# Patient Record
Sex: Male | Born: 1965 | Race: Black or African American | Hispanic: No | Marital: Married | Smoking: Never smoker
Health system: Southern US, Community
[De-identification: ages and names within clinical notes are randomized; demographics above are authoritative.]

## PROBLEM LIST (undated history)

## (undated) DIAGNOSIS — E7800 Pure hypercholesterolemia, unspecified: Secondary | ICD-10-CM

## (undated) DIAGNOSIS — R079 Chest pain, unspecified: Secondary | ICD-10-CM

## (undated) DIAGNOSIS — G4733 Obstructive sleep apnea (adult) (pediatric): Secondary | ICD-10-CM

## (undated) HISTORY — DX: Obstructive sleep apnea (adult) (pediatric): G47.33

## (undated) HISTORY — DX: Chest pain, unspecified: R07.9

---

## 1998-07-12 ENCOUNTER — Ambulatory Visit (HOSPITAL_COMMUNITY): Admission: RE | Admit: 1998-07-12 | Discharge: 1998-07-12 | Payer: Self-pay | Admitting: Family Medicine

## 1998-09-22 ENCOUNTER — Ambulatory Visit (HOSPITAL_BASED_OUTPATIENT_CLINIC_OR_DEPARTMENT_OTHER): Admission: RE | Admit: 1998-09-22 | Discharge: 1998-09-22 | Payer: Self-pay | Admitting: *Deleted

## 2009-03-29 ENCOUNTER — Encounter: Admission: RE | Admit: 2009-03-29 | Discharge: 2009-03-29 | Payer: Self-pay | Admitting: Family Medicine

## 2009-03-29 IMAGING — CR DG FOOT COMPLETE 3+V*R*
3 series · 3 of 3 positions shown · non-contrast
Comparison: None

CLINICAL DATA: Heel pain after basketball injury.

RIGHT FOOT COMPLETE - 3+ VIEW

[view not recorded (1 of 3)]
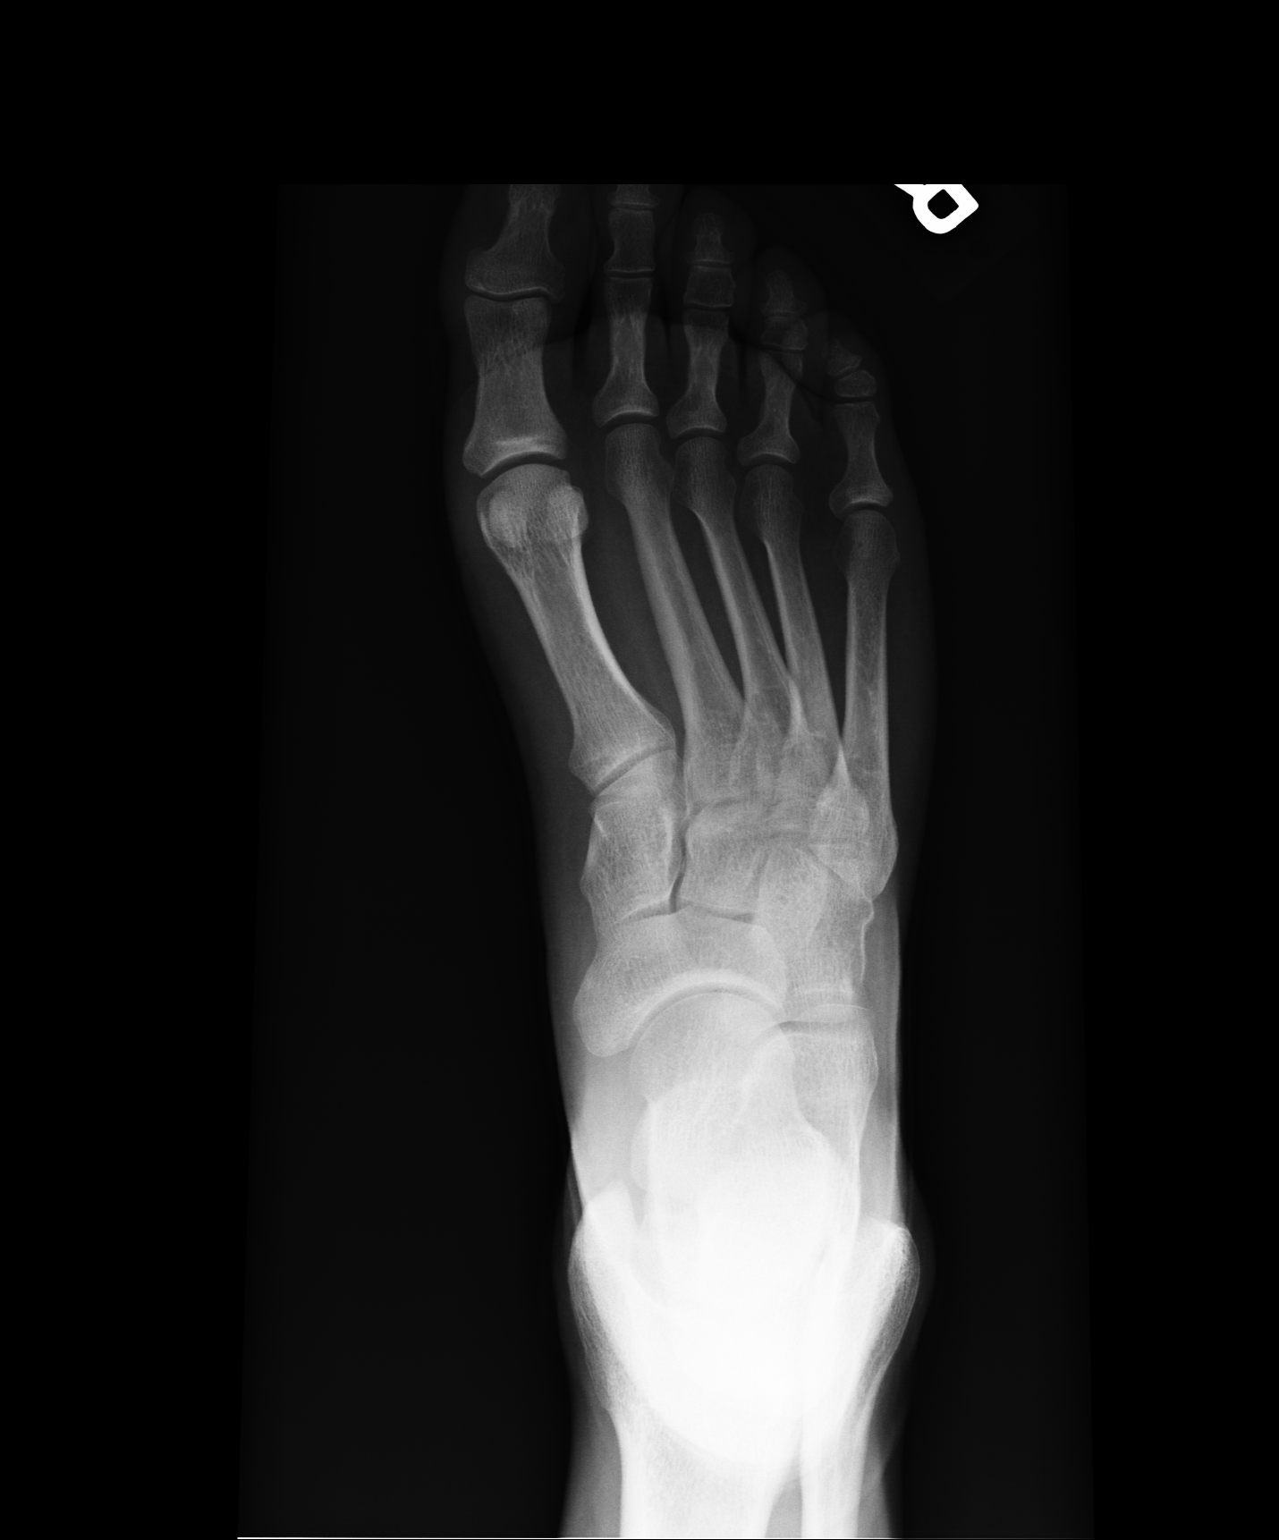

[view not recorded (2 of 3)]
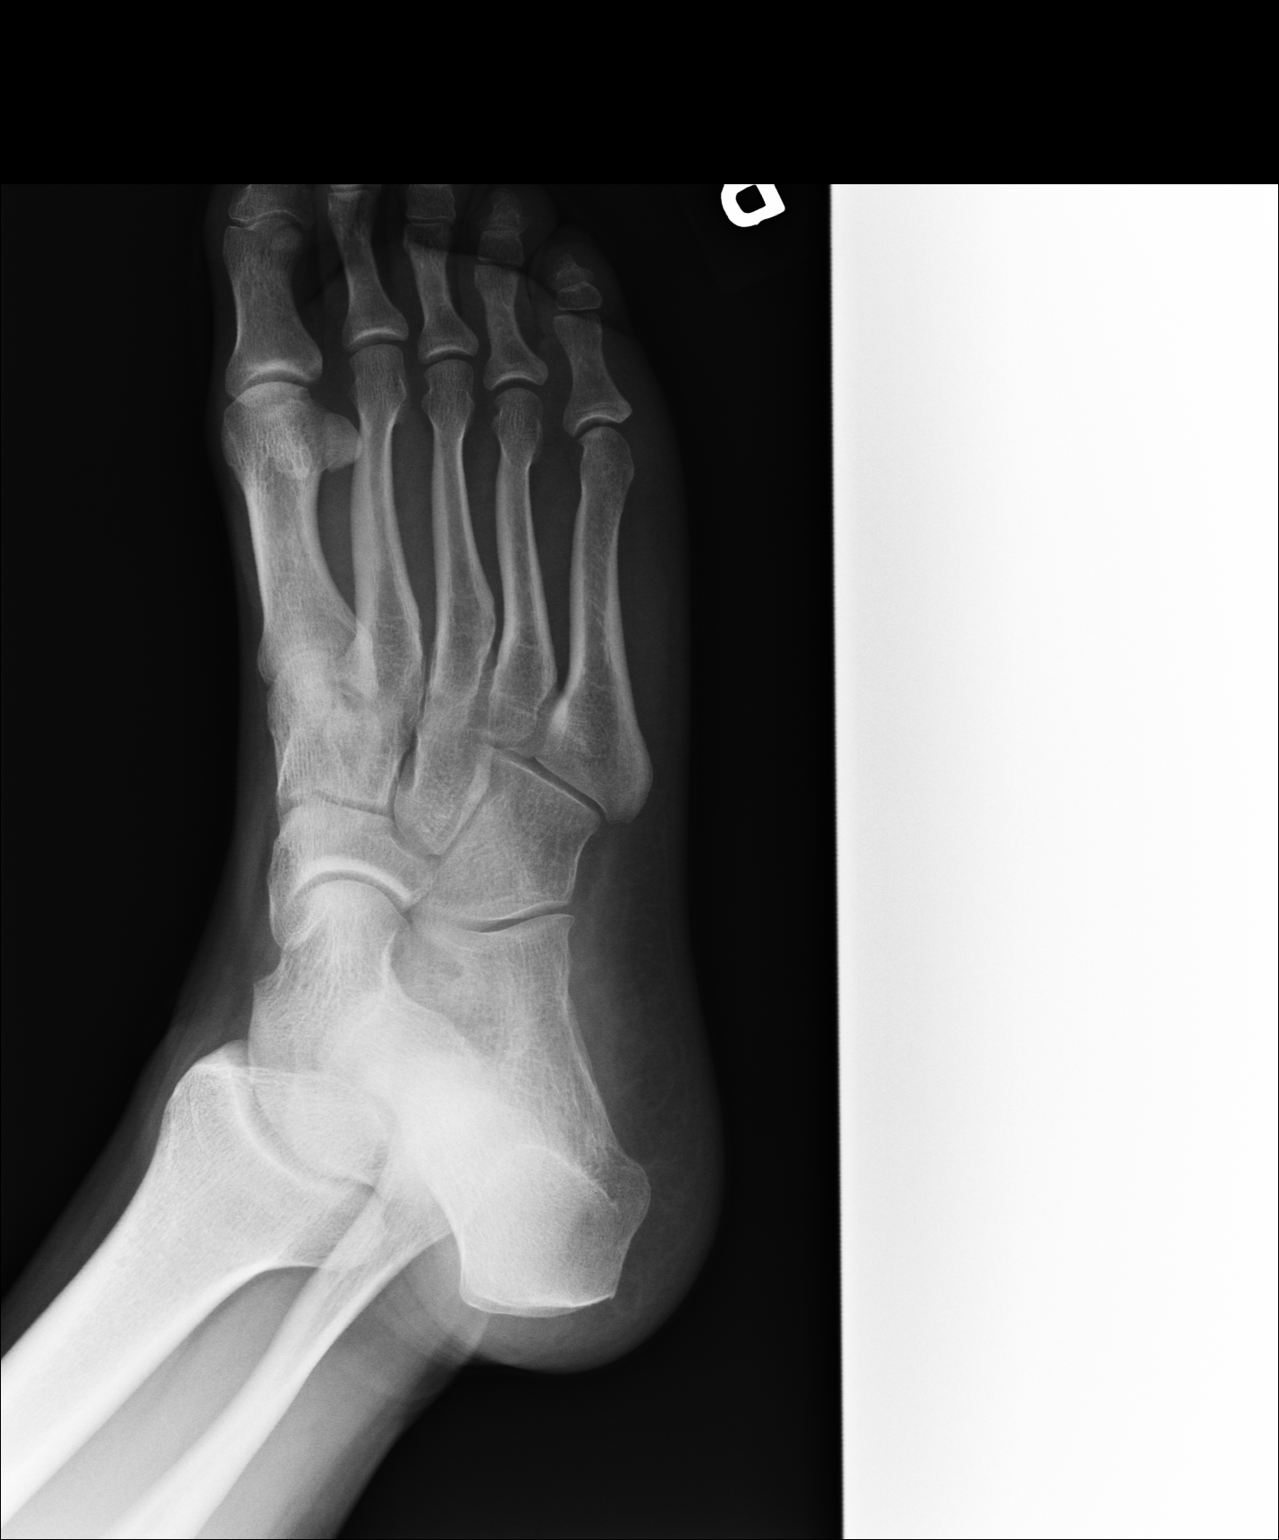

[view not recorded (3 of 3)]
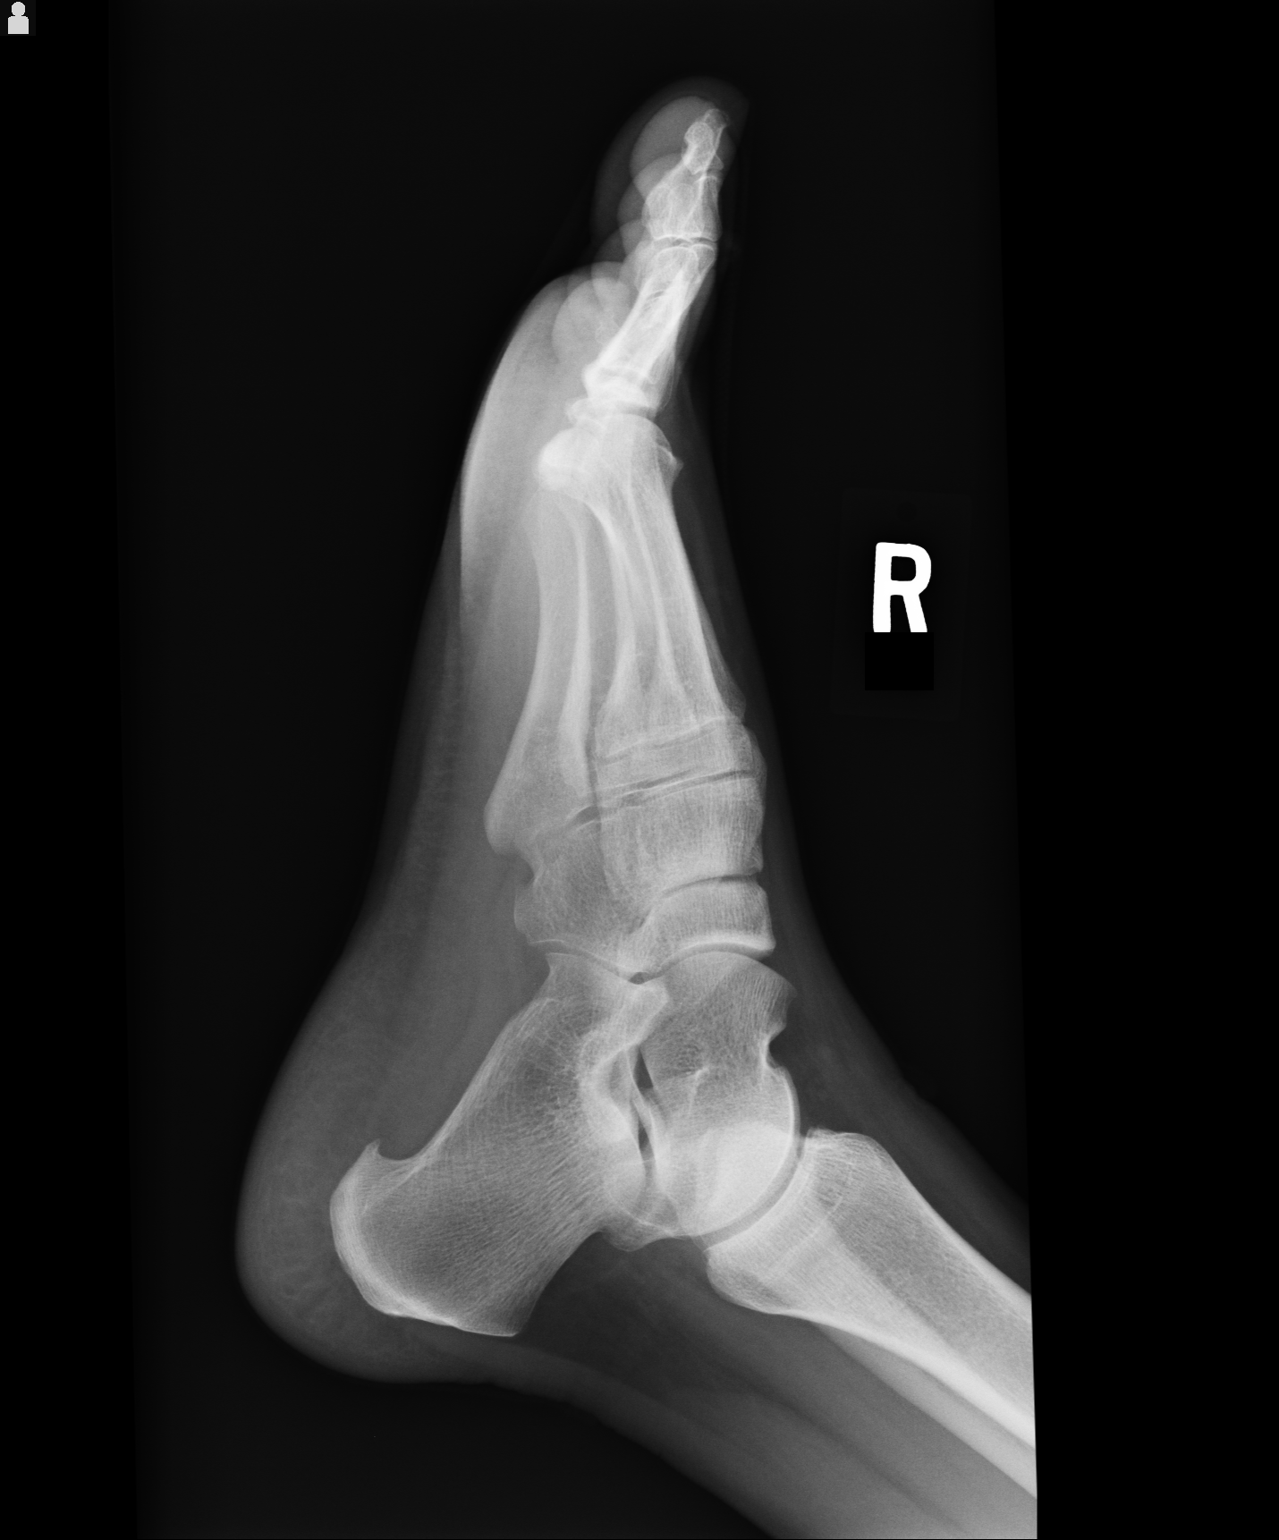

[3 of 3 positions shown; findings below may reference images not displayed]

FINDINGS: There is a plantar calcaneal spur.  No fracture.
Remainder of the foot is unremarkable.
IMPRESSION: Calcaneal spur without acute fracture.

## 2017-09-22 DIAGNOSIS — R0789 Other chest pain: Secondary | ICD-10-CM | POA: Diagnosis not present

## 2017-09-25 DIAGNOSIS — J309 Allergic rhinitis, unspecified: Secondary | ICD-10-CM | POA: Diagnosis not present

## 2017-09-25 DIAGNOSIS — R0789 Other chest pain: Secondary | ICD-10-CM | POA: Diagnosis not present

## 2017-09-25 DIAGNOSIS — E7800 Pure hypercholesterolemia, unspecified: Secondary | ICD-10-CM | POA: Diagnosis not present

## 2017-10-09 DIAGNOSIS — Z0000 Encounter for general adult medical examination without abnormal findings: Secondary | ICD-10-CM | POA: Diagnosis not present

## 2017-10-09 DIAGNOSIS — E7800 Pure hypercholesterolemia, unspecified: Secondary | ICD-10-CM | POA: Diagnosis not present

## 2017-10-09 DIAGNOSIS — R400 Somnolence: Secondary | ICD-10-CM | POA: Diagnosis not present

## 2018-04-07 DIAGNOSIS — R0982 Postnasal drip: Secondary | ICD-10-CM | POA: Diagnosis not present

## 2018-10-02 DIAGNOSIS — J069 Acute upper respiratory infection, unspecified: Secondary | ICD-10-CM | POA: Diagnosis not present

## 2018-10-11 DIAGNOSIS — N529 Male erectile dysfunction, unspecified: Secondary | ICD-10-CM | POA: Diagnosis not present

## 2018-10-11 DIAGNOSIS — E7800 Pure hypercholesterolemia, unspecified: Secondary | ICD-10-CM | POA: Diagnosis not present

## 2018-10-11 DIAGNOSIS — Z0000 Encounter for general adult medical examination without abnormal findings: Secondary | ICD-10-CM | POA: Diagnosis not present

## 2018-10-11 DIAGNOSIS — Z125 Encounter for screening for malignant neoplasm of prostate: Secondary | ICD-10-CM | POA: Diagnosis not present

## 2019-01-17 DIAGNOSIS — J1189 Influenza due to unidentified influenza virus with other manifestations: Secondary | ICD-10-CM | POA: Diagnosis not present

## 2019-04-07 DIAGNOSIS — M7732 Calcaneal spur, left foot: Secondary | ICD-10-CM | POA: Diagnosis not present

## 2019-04-07 DIAGNOSIS — M722 Plantar fascial fibromatosis: Secondary | ICD-10-CM | POA: Diagnosis not present

## 2019-04-14 DIAGNOSIS — M722 Plantar fascial fibromatosis: Secondary | ICD-10-CM | POA: Diagnosis not present

## 2019-04-21 DIAGNOSIS — M722 Plantar fascial fibromatosis: Secondary | ICD-10-CM | POA: Diagnosis not present

## 2019-04-21 DIAGNOSIS — M71572 Other bursitis, not elsewhere classified, left ankle and foot: Secondary | ICD-10-CM | POA: Diagnosis not present

## 2020-03-05 ENCOUNTER — Ambulatory Visit: Payer: Self-pay

## 2021-02-07 ENCOUNTER — Encounter (HOSPITAL_COMMUNITY): Payer: Self-pay | Admitting: *Deleted

## 2021-02-07 ENCOUNTER — Emergency Department (HOSPITAL_COMMUNITY)
Admission: EM | Admit: 2021-02-07 | Discharge: 2021-02-07 | Disposition: A | Discharge status: Home / Self Care | Payer: 59 | Attending: Emergency Medicine | Admitting: Emergency Medicine

## 2021-02-07 ENCOUNTER — Emergency Department (HOSPITAL_COMMUNITY): Payer: 59

## 2021-02-07 DIAGNOSIS — I4510 Unspecified right bundle-branch block: Secondary | ICD-10-CM | POA: Diagnosis not present

## 2021-02-07 DIAGNOSIS — R079 Chest pain, unspecified: Secondary | ICD-10-CM | POA: Diagnosis present

## 2021-02-07 DIAGNOSIS — R0789 Other chest pain: Secondary | ICD-10-CM | POA: Insufficient documentation

## 2021-02-07 HISTORY — DX: Pure hypercholesterolemia, unspecified: E78.00

## 2021-02-07 LAB — CBC
HCT: 48.2 % (ref 39.0–52.0)
Hemoglobin: 16.5 g/dL (ref 13.0–17.0)
MCH: 31 pg (ref 26.0–34.0)
MCHC: 34.2 g/dL (ref 30.0–36.0)
MCV: 90.6 fL (ref 80.0–100.0)
Platelets: 207 10*3/uL (ref 150–400)
RBC: 5.32 MIL/uL (ref 4.22–5.81)
RDW: 12.5 % (ref 11.5–15.5)
WBC: 6.4 10*3/uL (ref 4.0–10.5)
nRBC: 0 % (ref 0.0–0.2)

## 2021-02-07 LAB — BASIC METABOLIC PANEL
Anion gap: 12 (ref 5–15)
BUN: 21 mg/dL — ABNORMAL HIGH (ref 6–20)
CO2: 26 mmol/L (ref 22–32)
Calcium: 8.9 mg/dL (ref 8.9–10.3)
Chloride: 101 mmol/L (ref 98–111)
Creatinine, Ser: 1.05 mg/dL (ref 0.61–1.24)
GFR, Estimated: >60 mL/min (ref >60)
Glucose, Bld: 104 mg/dL — ABNORMAL HIGH (ref 70–99)
Potassium: 3.4 mmol/L — ABNORMAL LOW (ref 3.5–5.1)
Sodium: 139 mmol/L (ref 135–145)

## 2021-02-07 LAB — TROPONIN I (HIGH SENSITIVITY)
Troponin I (High Sensitivity): 4 ng/L (ref <18)
Troponin I (High Sensitivity): 5 ng/L (ref <18)

## 2021-02-07 MED ORDER — ACETAMINOPHEN 325 MG PO TABS
650.0000 mg | ORAL_TABLET | Freq: Once | ORAL | Status: AC
Start: 2021-02-07 — End: 2021-02-07
  Administered 2021-02-07: 650 mg via ORAL
  Filled 2021-02-07: qty 2

## 2021-02-07 NOTE — ED Notes (Signed)
Patient Alert and oriented to baseline. Stable and ambulatory to baseline. Patient verbalized understanding of the discharge instructions.  Patient belongings were taken by the patient.   

## 2021-02-07 NOTE — ED Triage Notes (Signed)
Pt reports onset this am of chest pain that he describes as "a spasm". Denies sob or n/v.

## 2021-02-07 NOTE — Discharge Instructions (Addendum)
-  Referral has been spent to cardiology for your new EKG findings as we discussed. They should call you within a week to schedule an appointment. If you do not hear from them you should call the number listed.  Your EKG shows a right bundle branch block. Without having a prior EKG to compare this too we are unsure how long it has been there.   Follow up with primary care doctor for recheck as needed.

## 2021-02-07 NOTE — ED Provider Notes (Signed)
[facility] EMERGENCY DEPARTMENT Provider Note   CSN: [id number] Arrival date & time: 02/07/21  1962     History Chief Complaint  Patient presents with  . Chest Pain    Thomas Mayo is a 55 y.o. male with past medical history significant for high cholesterol.  HPI Patient presents to emergency department today with chief complaint of chest pain x2 days.  The pain has been intermittent.  Is located in the left side of his chest and feels like a spasm or sharp pain.  He states he first noticed the pain yesterday when he was reaching for a space in a higher cabinet.  Pain lasted for several seconds.  This morning the pain returned when he was getting dressed for work.  Again lasting only seconds.  Patient rates the pain 7 out of 10 in severity.  He denies any associated diaphoresis, radiation of pain to jaw, back or arms, nausea, emesis.  Patient admits to playing 2 games of golf the two days before pain started. He denies any increase in stress.  Denies any drug or alcohol or tobacco use. Denies lower extremity pain or swelling, recent travel or immobilization, history of PE or DVT, family or personal history of bleeding or clotting disorders, cough or hemoptysis. Cardiac history includes father having MI in his 33s thought to be related to agent orange exposure.      Past Medical History:  Diagnosis Date  . High cholesterol     There are no problems to display for this patient.   History reviewed. No pertinent surgical history.     History reviewed. No pertinent family history.  Social History   Tobacco Use  . Smoking status: Never Smoker  . Smokeless tobacco: Never Used  Substance Use Topics  . Alcohol use: Never  . Drug use: Never    Home Medications Prior to Admission medications   Not on File    Allergies    Patient has no known allergies.  Review of Systems   Review of Systems All other systems are reviewed and are negative for acute  change except as noted in the HPI.  Physical Exam Updated Vital Signs BP 134/88 (BP Location: Left Arm)   Pulse 83   Temp (!) 97.5 F (36.4 C) (Oral)   Resp 17   Ht 5' 8.5" (1.74 m)   Wt 82.1 kg   SpO2 99%   BMI 27.12 kg/m   Physical Exam Vitals and nursing note reviewed.  Constitutional:      General: He is not in acute distress.    Appearance: He is not ill-appearing.  HENT:     Head: Normocephalic and atraumatic.     Right Ear: Tympanic membrane and external ear normal.     Left Ear: Tympanic membrane and external ear normal.     Nose: Nose normal.     Mouth/Throat:     Mouth: Mucous membranes are moist.     Pharynx: Oropharynx is clear.  Eyes:     General: No scleral icterus.       Right eye: No discharge.        Left eye: No discharge.     Extraocular Movements: Extraocular movements intact.     Conjunctiva/sclera: Conjunctivae normal.     Pupils: Pupils are equal, round, and reactive to light.  Neck:     Vascular: No JVD.  Cardiovascular:     Rate and Rhythm: Normal rate and regular rhythm.  Pulses: Normal pulses.          Radial pulses are 2+ on the right side and 2+ on the left side.       Dorsalis pedis pulses are 2+ on the right side and 2+ on the left side.     Heart sounds: Normal heart sounds.  Pulmonary:     Comments: Lungs clear to auscultation in all fields. Symmetric chest rise. No wheezing, rales, or rhonchi. Chest:     Chest wall: Tenderness present.  Abdominal:     Comments: Abdomen is soft, non-distended, and non-tender in all quadrants. No rigidity, no guarding. No peritoneal signs.  Musculoskeletal:        General: Normal range of motion.     Cervical back: Normal range of motion.     Right lower leg: No edema.     Left lower leg: No edema.  Skin:    General: Skin is warm and dry.     Capillary Refill: Capillary refill takes less than 2 seconds.  Neurological:     Mental Status: He is oriented to person, place, and time.     GCS:  GCS eye subscore is 4. GCS verbal subscore is 5. GCS motor subscore is 6.     Comments: Fluent speech, no facial droop.  Psychiatric:        Behavior: Behavior normal.     ED Results / Procedures / Treatments   Labs (all labs ordered are listed, but only abnormal results are displayed) Labs Reviewed  BASIC METABOLIC PANEL - Abnormal; Notable for the following components:      Result Value   Potassium 3.4 (*)    Glucose, Bld 104 (*)    BUN 21 (*)    All other components within normal limits  CBC  TROPONIN I (HIGH SENSITIVITY)  TROPONIN I (HIGH SENSITIVITY)    EKG EKG Interpretation  Date/Time:  Monday February 07 2021 08:10:57 EST Ventricular Rate:  78 PR Interval:  158 QRS Duration: 112 QT Interval:  366 QTC Calculation: 417 R Axis:   -61 Text Interpretation: Normal sinus rhythm Right bundle branch block Left anterior fascicular block. Bifascicular block. No previous tracing Confirmed by Cathren Laine ([id number]) on 02/07/2021 9:44:47 AM   Radiology DG Chest 2 View  Result Date: 02/07/2021 CLINICAL DATA:  Chest pain. EXAM: CHEST - 2 VIEW COMPARISON:  None. FINDINGS: The heart size and mediastinal contours are within normal limits. Both lungs are clear. The visualized skeletal structures are unremarkable. IMPRESSION: No active cardiopulmonary disease. Electronically Signed   By: Norva Pavlov M.D.   On: 02/07/2021 08:36    Procedures Procedures   Medications Ordered in ED Medications  acetaminophen (TYLENOL) tablet 650 mg (650 mg Oral Given 02/07/21 1102)    ED Course  I have reviewed the triage vital signs and the nursing notes.  Pertinent labs & imaging results that were available during my care of the patient were reviewed by me and considered in my medical decision making (see chart for details).    MDM Rules/Calculators/A&P                          History provided by patient with additional history obtained from chart review.     Patient presents to the  emergency department with chest pain. Patient nontoxic appearing, in no apparent distress, vitals without significant abnormality. Fairly benign physical exam. DDX: ACS, pulmonary embolism, dissection, pneumothorax, effusion, infiltrate, arrhythmia, anemia, electrolyte derangement,  MSK. Evaluation initiated with labs, EKG, and CXR. Patient on cardiac monitor. Tylenol given for pain.  Work-up in the ER unremarkable. Labs reviewed, no leukocytosis, anemia, or significant electrolyte abnormality. CXR without infiltrate, effusion, pneumothorax, or fracture/dislocation.   Heart score of 3, EKG without obvious ischemia, although does have right bundle branch block, left anterior fascicular block. No prior to compare. Patient does not recall history of abnormal EKGs. Delta troponin negative, doubt ACS. Patient is low risk wells, PERC negative, doubt pulmonary embolism. Pain is not a tearing sensation, symmetric pulses, no widening of mediastinum on CXR, doubt dissection. Cardiac monitor reviewed, no notable arrhythmias or tachycardia. Patient has appeared hemodynamically stable throughout ER visit and appears safe for discharge with close PCP/cardiology follow up. I discussed results, treatment plan, need for PCP follow-up, and return precautions with the patient. Provided opportunity for questions, patient confirmed understanding and is in agreement with plan. Cardiology referral sent. Case has been discussed with ED attending Dr. Denton Lank who agrees with the above plan to discharge.     Portions of this note were generated with Scientist, clinical (histocompatibility and immunogenetics). Dictation errors may occur despite best attempts at proofreading.  Final Clinical Impression(s) / ED Diagnoses Final diagnoses:  Atypical chest pain  Right bundle branch block    Rx / DC Orders ED Discharge Orders         Ordered    Ambulatory referral to Cardiology        02/07/21 1147           Kandice Hams 02/07/21 1221     Cathren Laine, MD 02/08/21 1422

## 2021-02-15 ENCOUNTER — Telehealth: Payer: Self-pay

## 2021-02-15 NOTE — Telephone Encounter (Signed)
ERROR

## 2021-02-17 ENCOUNTER — Encounter: Payer: Self-pay | Admitting: Internal Medicine

## 2021-02-17 ENCOUNTER — Ambulatory Visit: Payer: 59 | Admitting: Internal Medicine

## 2021-02-17 ENCOUNTER — Other Ambulatory Visit: Payer: Self-pay

## 2021-02-17 VITALS — BP 128/82 | HR 76 | Ht 68.5 in | Wt 185.0 lb

## 2021-02-17 DIAGNOSIS — E782 Mixed hyperlipidemia: Secondary | ICD-10-CM

## 2021-02-17 DIAGNOSIS — R079 Chest pain, unspecified: Secondary | ICD-10-CM | POA: Insufficient documentation

## 2021-02-17 DIAGNOSIS — R400 Somnolence: Secondary | ICD-10-CM | POA: Insufficient documentation

## 2021-02-17 NOTE — Patient Instructions (Signed)
Medication Instructions:  Your physician recommends that you continue on your current medications as directed. Please refer to the Current Medication list given to you today.  *If you need a refill on your cardiac medications before your next appointment, please call your pharmacy*   Lab Work: NONE If you have labs (blood work) drawn today and your tests are completely normal, you will receive your results only by: Marland Kitchen MyChart Message (if you have MyChart) OR . A paper copy in the mail If you have any lab test that is abnormal or we need to change your treatment, we will call you to review the results.   Testing/Procedures: Your physician has requested that you have a Coronary Calcium Score Test.  Your physician has requested that you have a Sleep Study.    Follow-Up: At [facility], you and your health needs are our priority.  As part of our continuing mission to provide you with exceptional heart care, we have created designated Provider Care Teams.  These Care Teams include your primary Cardiologist (physician) and Advanced Practice Providers (APPs -  Physician Assistants and Nurse Practitioners) who all work together to provide you with the care you need, when you need it.  We recommend signing up for the patient portal called "MyChart".  Sign up information is provided on this After Visit Summary.  MyChart is used to connect with patients for Virtual Visits (Telemedicine).  Patients are able to view lab/test results, encounter notes, upcoming appointments, etc.  Non-urgent messages can be sent to your provider as well.   To learn more about what you can do with MyChart, go to [email or web address].    Your next appointment:   3-4 month(s)  The format for your next appointment:   In Person  Provider:   You may see Izora Ribas, MD or one of the following Advanced Practice Providers on your designated Care Team:    Ronie Spies, PA-C  Jacolyn Reedy, PA-C

## 2021-02-17 NOTE — Progress Notes (Signed)
Cardiology Office Note:    Date:  02/17/2021   ID:  Thomas Mayo, DOB 1966/08/21, MRN [id number]  PCP:  Donald Prose, MD   [place]  Cardiologist:  No primary care provider on file.  Advanced Practice Provider:  No care team member to display Electrophysiologist:  None       CC: Chest pain Consulted for the evaluation of chest pain at the behest of Donald Prose, MD  History of Present Illness:    Thomas Mayo is a 55 y.o. male with a hx of HLD who presents for evaluation 02/17/21.  Patient notes that he was feeling chest pain.    Prior to this visit, had 02/07/21 CP evaluation in the ED: with normal troponin, and benign ECG.  Patient had chest pain after reaching up and get something on Sunday 02/06/21; was getting ready for work and had central chest pain.  It started to improved but worsened when he bent over.  Similar to what happened to his friend in the past.  Pain improved largely without other intervention; some improvement with Tylenol.  Since then, has had no chest pain, chest pressure, chest tightness, chest stinging.   Patient exertion notable for playing golf and walking a lotfeels no symptoms.  Has some plantar fasciitis which has decreased in walking.  No shortness of breath, DOE.  No PND or orthopnea.  No bendopnea, weight gain, leg swelling, or abdominal swelling.  No syncope or near syncope . Notes  no palpitations or funny heart beats.     Notes Daytime Somnolence- wife notes snoring and  apneic events.  Patient reports NO prior cardiac testing including  echo,  stress test,  heart catheterizations,  cardioversion,  ablations.  Past Medical History:  Diagnosis Date  . Chest pain   . High cholesterol     History reviewed. No pertinent surgical history.  Current Medications: Current Meds  Medication Sig  . atorvastatin (LIPITOR) 10 MG tablet Take 10 mg by mouth daily.  . Coenzyme Q10 (CO Q-10) 100 MG CAPS Take 1 capsule by mouth  daily.  . fluticasone (FLONASE) 50 MCG/ACT nasal spray Place 1 spray into both nostrils as needed for allergies.  . Multiple Vitamins-Minerals (PRESERVISION AREDS 2 PO) Take 1 tablet by mouth daily.  . tadalafil (CIALIS) 10 MG tablet Take 10 mg by mouth as needed for erectile dysfunction.     Allergies:   Patient has no known allergies.   Social History   Socioeconomic History  . Marital status: Single    Spouse name: Not on file  . Number of children: Not on file  . Years of education: Not on file  . Highest education level: Not on file  Occupational History  . Not on file  Tobacco Use  . Smoking status: Never Smoker  . Smokeless tobacco: Never Used  Substance and Sexual Activity  . Alcohol use: Never  . Drug use: Never  . Sexual activity: Not on file  Other Topics Concern  . Not on file  Social History Narrative  . Not on file   Social Determinants of Health   Financial Resource Strain: Not on file  Food Insecurity: Not on file  Transportation Needs: Not on file  Physical Activity: Not on file  Stress: Not on file  Social Connections: Not on file   Social:  Has a twin   Family History: The patient's family history includes Heart attack (age of onset: 63) in his father. History  of coronary artery disease notable for father-possibly related to Agent Orange.  ROS:   Please see the history of present illness.     All other systems reviewed and are negative.  EKGs/Labs/Other Studies Reviewed:    The following studies were reviewed today:  EKG:  02/17/21: SR 78 WNL (RBBB not met)  Recent Labs: 02/07/2021: BUN 21; Creatinine, Ser 1.05; Hemoglobin 16.5; Platelets 207; Potassium 3.4; Sodium 139  Recent Lipid Panel No results found for: CHOL, TRIG, HDL, CHOLHDL, VLDL, LDLCALC, LDLDIRECT   Risk Assessment/Calculations:     ASCVD Risk 5.3%  Physical Exam:    VS:  BP 128/82   Pulse 76   Ht 5' 8.5" (1.74 m)   Wt 185 lb (83.9 kg)   SpO2 95%   BMI 27.72 kg/m      Wt Readings from Last 3 Encounters:  02/17/21 185 lb (83.9 kg)  02/07/21 181 lb (82.1 kg)     GEN:  Well nourished, well developed in no acute distress HEENT: Normal NECK: No JVD; No carotid bruits LYMPHATICS: No lymphadenopathy CARDIAC: RRR, no murmurs, rubs, gallops RESPIRATORY:  Clear to auscultation without rales, wheezing or rhonchi  ABDOMEN: Soft, non-tender, non-distended MUSCULOSKELETAL:  No edema; No deformity  SKIN: Warm and dry NEUROLOGIC:  Alert and oriented x 3 PSYCHIATRIC:  Normal affect   ASSESSMENT:    1. Chest pain of uncertain etiology   2. Daytime somnolence   3. Mixed hyperlipidemia    PLAN:    In order of problems listed above:  Chest Pain Syndrome - The patient presents with non-cardiac chest pain - EKG notable for iRBBB - ASCVD risk estimated at 5.6% -- Continue current statin   - had discussed risks and benefits of stress testing if recurrence of symptoms  Daytime Somnolence with Apnea Epworth 18 - will get Sleep study  Hyperlipidemia (mixed) -LDL goal less than 100 -continue current statin (thinks that he has some aches with this) - getting CAC scan before uptitrating  Three to four months (can place in the old provider use spots) follow up unless new symptoms or abnormal test results warranting change in plan   Medication Adjustments/Labs and Tests Ordered: Current medicines are reviewed at length with the patient today.  Concerns regarding medicines are outlined above.  Orders Placed This Encounter  Procedures  . CT CARDIAC SCORING (SELF PAY ONLY)  . Ambulatory referral to Sleep Studies  . Split night study   No orders of the defined types were placed in this encounter.   Patient Instructions  Medication Instructions:  Your physician recommends that you continue on your current medications as directed. Please refer to the Current Medication list given to you today.  *If you need a refill on your cardiac medications before  your next appointment, please call your pharmacy*   Lab Work: NONE If you have labs (blood work) drawn today and your tests are completely normal, you will receive your results only by: Marland Kitchen MyChart Message (if you have MyChart) OR . A paper copy in the mail If you have any lab test that is abnormal or we need to change your treatment, we will call you to review the results.   Testing/Procedures: Your physician has requested that you have a Coronary Calcium Score Test.  Your physician has requested that you have a Sleep Study.    Follow-Up: At [facility], you and your health needs are our priority.  As part of our continuing mission to provide you with exceptional heart  care, we have created designated Provider Care Teams.  These Care Teams include your primary Cardiologist (physician) and Advanced Practice Providers (APPs -  Physician Assistants and Nurse Practitioners) who all work together to provide you with the care you need, when you need it.  We recommend signing up for the patient portal called "MyChart".  Sign up information is provided on this After Visit Summary.  MyChart is used to connect with patients for Virtual Visits (Telemedicine).  Patients are able to view lab/test results, encounter notes, upcoming appointments, etc.  Non-urgent messages can be sent to your provider as well.   To learn more about what you can do with MyChart, go to [email or web address].    Your next appointment:   3-4 month(s)  The format for your next appointment:   In Person  Provider:   You may see Gasper Sells, MD or one of the following Advanced Practice Providers on your designated Care Team:    Melina Copa, PA-C  Ermalinda Barrios, PA-C          Signed, Werner Lean, MD  02/17/2021 3:19 PM    [place]

## 2021-02-25 ENCOUNTER — Telehealth: Payer: Self-pay | Admitting: *Deleted

## 2021-02-25 NOTE — Telephone Encounter (Signed)
PA submitted to [facility] via web portal for split night sleep study.

## 2021-03-01 ENCOUNTER — Telehealth: Payer: Self-pay | Admitting: *Deleted

## 2021-03-01 NOTE — Telephone Encounter (Signed)
Staff message sent to [facility] denied. Split night. Not medically neccessary. Ok to do [employer]. No PA is required or call [phone] and do peer to peer.

## 2021-03-10 ENCOUNTER — Telehealth: Payer: Self-pay | Admitting: *Deleted

## 2021-03-10 DIAGNOSIS — R400 Somnolence: Secondary | ICD-10-CM

## 2021-03-10 NOTE — Telephone Encounter (Signed)
Thomas Mayo, CMA  Reesa Chew, CMA [employer] denied split night. Ok to do [employer] or peer to peer [phone].

## 2021-03-10 NOTE — Telephone Encounter (Signed)
Patient is scheduled for HST study on 04/22/21 12:30. Patient understands his sleep study will be done at [facility] sleep lab. Patient understands he will receive a sleep packet in a week or so. Patient understands to call if he does not receive the sleep packet in a timely manner. Patient agrees with treatment and thanked me for call.

## 2021-03-10 NOTE — Telephone Encounter (Signed)
-----   Message from Gaynelle Cage, CMA sent at 03/01/2021  1:52 PM EST ----- [employer] denied split night. Ok to do [employer] or peer to peer [phone]. ----- Message ----- From: Gaynelle Cage, CMA Sent: 02/25/2021  11:44 AM EST To: [facility] Sleep Studies  Split night

## 2021-03-15 ENCOUNTER — Ambulatory Visit (INDEPENDENT_AMBULATORY_CARE_PROVIDER_SITE_OTHER)
Admission: RE | Admit: 2021-03-15 | Discharge: 2021-03-15 | Disposition: A | Discharge status: Home / Self Care | Payer: Self-pay | Source: Ambulatory Visit | Attending: Internal Medicine | Admitting: Internal Medicine

## 2021-03-15 ENCOUNTER — Other Ambulatory Visit: Payer: Self-pay

## 2021-03-15 DIAGNOSIS — E782 Mixed hyperlipidemia: Secondary | ICD-10-CM

## 2021-03-18 ENCOUNTER — Telehealth: Payer: Self-pay

## 2021-03-18 DIAGNOSIS — E782 Mixed hyperlipidemia: Secondary | ICD-10-CM

## 2021-03-18 NOTE — Telephone Encounter (Signed)
Called pt reviewed lab results and MD recommendations.  He is agreeable to POC, lab appointment scheduled for 03/21/21.  Orders placed.

## 2021-03-18 NOTE — Telephone Encounter (Signed)
-----   Message from Christell Constant, MD sent at 03/16/2021  2:33 PM EDT ----- Results: Elevated CAC Plan: Get LDL and consider increasing statin  Christell Constant, MD

## 2021-03-21 ENCOUNTER — Other Ambulatory Visit: Payer: Self-pay

## 2021-03-21 ENCOUNTER — Other Ambulatory Visit: Payer: 59 | Admitting: *Deleted

## 2021-03-21 DIAGNOSIS — E782 Mixed hyperlipidemia: Secondary | ICD-10-CM

## 2021-03-21 LAB — LIPID PANEL
Chol/HDL Ratio: 3.7 ratio (ref 0.0–5.0)
Cholesterol, Total: 169 mg/dL (ref 100–199)
HDL: 46 mg/dL (ref >39)
LDL Chol Calc (NIH): 111 mg/dL — ABNORMAL HIGH (ref 0–99)
Triglycerides: 60 mg/dL (ref 0–149)
VLDL Cholesterol Cal: 12 mg/dL (ref 5–40)

## 2021-03-24 ENCOUNTER — Telehealth: Payer: Self-pay

## 2021-03-24 DIAGNOSIS — E782 Mixed hyperlipidemia: Secondary | ICD-10-CM

## 2021-03-24 MED ORDER — ATORVASTATIN CALCIUM 20 MG PO TABS: 20.0000 mg | ORAL_TABLET | Freq: Every day | ORAL | 3 refills | Status: DC

## 2021-03-24 NOTE — Telephone Encounter (Signed)
-----   Message from Christell Constant, MD sent at 03/24/2021  1:38 PM EDT ----- Results: LDL above goal Plan: Increase statin to atorvastatin 20 mg PO daily; lipids and lfts in three months  Christell Constant, MD

## 2021-03-24 NOTE — Telephone Encounter (Signed)
The patient has been notified of the result and verbalized understanding. Orders placed for FLP, LFT, and atorvastatin 20mg  PO QD.   All questions (if any) were answered. , RN 03/24/2021 2:32 PM

## 2021-04-04 ENCOUNTER — Other Ambulatory Visit: Payer: Self-pay

## 2021-04-04 ENCOUNTER — Ambulatory Visit: Payer: 59 | Admitting: Podiatry

## 2021-04-04 ENCOUNTER — Ambulatory Visit (INDEPENDENT_AMBULATORY_CARE_PROVIDER_SITE_OTHER): Payer: 59

## 2021-04-04 DIAGNOSIS — M722 Plantar fascial fibromatosis: Secondary | ICD-10-CM

## 2021-04-04 MED ORDER — MELOXICAM 15 MG PO TABS: 15.0000 mg | ORAL_TABLET | Freq: Every day | ORAL | 1 refills | Status: DC

## 2021-04-04 MED ORDER — BETAMETHASONE SOD PHOS & ACET 6 (3-3) MG/ML IJ SUSP
3.0000 mg | Freq: Once | INTRAMUSCULAR | Status: AC
  Administered 2021-04-04: 3 mg via INTRA_ARTICULAR

## 2021-04-04 NOTE — Progress Notes (Signed)
   Subjective: 55 y.o. male presenting today for evaluation of bilateral heel pain is been going on for several years now.  Patient has been treated in the past by multiple physicians conservatively.  He currently wears orthotics and has had steroid injections in the past.  He presents today for further treatment and evaluation   Past Medical History:  Diagnosis Date  . Chest pain   . High cholesterol      Objective: Physical Exam General: The patient is alert and oriented x3 in no acute distress.  Dermatology: Skin is warm, dry and supple bilateral lower extremities. Negative for open lesions or macerations bilateral.   Vascular: Dorsalis Pedis and Posterior Tibial pulses palpable bilateral.  Capillary fill time is immediate to all digits.  Neurological: Epicritic and protective threshold intact bilateral.   Musculoskeletal: Tenderness to palpation to the plantar aspect of the bilateral heels along the plantar fascia. All other joints range of motion within normal limits bilateral. Strength 5/5 in all groups bilateral.   Radiographic exam: Normal osseous mineralization. Joint spaces preserved. No fracture/dislocation/boney destruction. No other soft tissue abnormalities or radiopaque foreign bodies.   Assessment: 1. plantar fasciitis bilateral feet  Plan of Care:  1. Patient evaluated. Xrays reviewed.   2. Injection of 0.5cc Celestone soluspan injected into the bilateral heels.  3.  Prescription for meloxicam 15 mg daily 4.  Continue custom molded orthotics 5.  Today we had a long detailed conversation regarding the patient's symptoms.  He has not had plantar fasciitis off and on for several years.  This is all despite conservative treatment modalities.  I do believe that surgery would be warranted at this point.  At the moment he is a very avid golfer and summertime is just beginning.  We will try and hold off on surgery for now 6.  Return to clinic in 6 weeks  *Avid golfer.   Has a tournament May 5.  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  Felecia Shelling, DPM [facility]  Dr. Felecia Shelling, DPM    [address] [place], [place] [address]                Office [phone]  Fax [phone]

## 2021-04-22 ENCOUNTER — Other Ambulatory Visit: Payer: Self-pay

## 2021-04-22 ENCOUNTER — Ambulatory Visit (HOSPITAL_BASED_OUTPATIENT_CLINIC_OR_DEPARTMENT_OTHER): Payer: 59 | Attending: Internal Medicine | Admitting: Cardiology

## 2021-04-22 DIAGNOSIS — R0902 Hypoxemia: Secondary | ICD-10-CM | POA: Insufficient documentation

## 2021-04-22 DIAGNOSIS — G4733 Obstructive sleep apnea (adult) (pediatric): Secondary | ICD-10-CM | POA: Diagnosis not present

## 2021-04-22 DIAGNOSIS — R400 Somnolence: Secondary | ICD-10-CM | POA: Diagnosis not present

## 2021-04-22 DIAGNOSIS — G4734 Idiopathic sleep related nonobstructive alveolar hypoventilation: Secondary | ICD-10-CM

## 2021-04-27 NOTE — Procedures (Signed)
   Patient Name: Thomas Mayo, Thomas Mayo Date: 04/22/2021 Gender: Male D.O.B: 1966-07-31 Age (years): 71 Referring Provider: Riley Lam Height (inches): 69 Interpreting Physician: Armanda Magic MD, ABSM Weight (lbs): 180 RPSGT: Doniphan Sink BMI: 27 MRN: [id number] Neck Size: 16.25  CLINICAL INFORMATION Sleep Study Type: HST  Indication for sleep study: N/A  Epworth Sleepiness Score: 6  SLEEP STUDY TECHNIQUE A multi-channel overnight portable sleep study was performed. The channels recorded were: nasal airflow, thoracic respiratory movement, and oxygen saturation with a pulse oximetry. Snoring was also monitored.  MEDICATIONS Patient self administered medications include: N/A.  SLEEP ARCHITECTURE Patient was studied for 574.6 minutes. The sleep efficiency was 100.0 % and the patient was supine for 69.2%. The arousal index was 0.0 per hour.  RESPIRATORY PARAMETERS The overall AHI was 51.2 per hour, with a central apnea index of 0 per hour.  The oxygen nadir was 70% during sleep.  CARDIAC DATA Mean heart rate during sleep was 74.4 bpm.  IMPRESSIONS - Severe obstructive sleep apnea occurred during this study (AHI = 51.2/h). - Severe oxygen desaturation was noted during this study (Min O2 = 70%). - Patient snored 6.0% during the sleep.  DIAGNOSIS - Obstructive Sleep Apnea (G47.33) - Nocturnal Hypoxemia (G47.36)  RECOMMENDATIONS - Recommend in lab CPAP titration. - Positional therapy avoiding supine position during sleep. - Avoid alcohol, sedatives and other CNS depressants that may worsen sleep apnea and disrupt normal sleep architecture. - Sleep hygiene should be reviewed to assess factors that may improve sleep quality. - Weight management and regular exercise should be initiated or continued. - Patient may benefit from in-lab study  [Electronically signed] 04/27/2021 09:56 PM  Armanda Magic MD, ABSM Diplomate, American Board of Sleep Medicine

## 2021-04-30 ENCOUNTER — Telehealth: Payer: Self-pay | Admitting: *Deleted

## 2021-04-30 NOTE — Telephone Encounter (Signed)
-----   Message from Quintella Reichert, MD sent at 04/27/2021  9:58 PM EDT ----- Please let patient know that they have sleep apnea and recommend CPAP titration. Please set up titration in the sleep lab.

## 2021-04-30 NOTE — Telephone Encounter (Addendum)
The patient has been notified of the result and verbalized understanding.  All questions (if any) were answered. Patient understands his sleep study showed they have sleep apnea and recommend CPAP titration. Please set up titration in the sleep lab ASAP.  Titration ordered    

## 2021-05-02 ENCOUNTER — Other Ambulatory Visit: Payer: Self-pay

## 2021-05-02 ENCOUNTER — Ambulatory Visit: Payer: 59 | Admitting: Podiatry

## 2021-05-02 DIAGNOSIS — M722 Plantar fascial fibromatosis: Secondary | ICD-10-CM | POA: Diagnosis not present

## 2021-05-02 MED ORDER — MELOXICAM 15 MG PO TABS: 15.0000 mg | ORAL_TABLET | Freq: Every day | ORAL | 1 refills | Status: DC

## 2021-05-06 ENCOUNTER — Telehealth: Payer: Self-pay | Admitting: *Deleted

## 2021-05-06 DIAGNOSIS — R400 Somnolence: Secondary | ICD-10-CM

## 2021-05-06 NOTE — Telephone Encounter (Signed)
-----   Message from Reesa Chew, CMA sent at 05/02/2021 12:28 PM EDT ----- Regarding: precert  recommend CPAP titration

## 2021-05-06 NOTE — Telephone Encounter (Signed)
Prior Authorization for CPAP titration sent to [facility] via web portal. Tracking Number [id number].

## 2021-05-10 NOTE — Telephone Encounter (Signed)
Received a call from [facility] denying CPAP titration. Ordering provider can call [facility] @ [phone] within 21 days from the denial to do a peer to peer or order APAP.

## 2021-05-16 NOTE — Telephone Encounter (Signed)
Upon patient request DME selection is [facility] Patient understands he will be contacted by [facility] to set up his cpap. Patient understands to call if [facility] does not contact him with new setup in a timely manner. Patient understands they will be called once confirmation has been received from [facility] that they have received their new machine to schedule 10 week follow up appointment.   [facility] notified of new cpap order  Please add to airview Patient was grateful for the call and thanked me  

## 2021-05-16 NOTE — Telephone Encounter (Signed)
Apap order placed to [facility] to Order ResMed auto CPAP from 4-15cm H2O with heated humidity and mask of choice and get a download in 2 weeks and followup with me in 4 weeks

## 2021-05-16 NOTE — Addendum Note (Signed)
Addended by: Reesa Chew on: 05/16/2021 06:14 PM   Modules accepted: Orders

## 2021-05-18 DIAGNOSIS — M722 Plantar fascial fibromatosis: Secondary | ICD-10-CM | POA: Diagnosis not present

## 2021-05-18 MED ORDER — BETAMETHASONE SOD PHOS & ACET 6 (3-3) MG/ML IJ SUSP
3.0000 mg | Freq: Once | INTRAMUSCULAR | Status: AC
  Administered 2021-05-18: 3 mg via INTRA_ARTICULAR

## 2021-05-18 MED ORDER — MELOXICAM 15 MG PO TABS: 15.0000 mg | ORAL_TABLET | Freq: Every day | ORAL | 1 refills | Status: DC

## 2021-05-18 NOTE — Progress Notes (Signed)
   Subjective: 55 y.o. male presenting today for evaluation of bilateral heel pain is been going on for several years now.  Patient has been treated in the past by multiple physicians conservatively.  He currently wears orthotics and has had steroid injections in the past.  He presents today for further treatment and evaluation   Past Medical History:  Diagnosis Date  . Chest pain   . High cholesterol      Objective: Physical Exam General: The patient is alert and oriented x3 in no acute distress.  Dermatology: Skin is warm, dry and supple bilateral lower extremities. Negative for open lesions or macerations bilateral.   Vascular: Dorsalis Pedis and Posterior Tibial pulses palpable bilateral.  Capillary fill time is immediate to all digits.  Neurological: Epicritic and protective threshold intact bilateral.   Musculoskeletal: Tenderness to palpation to the plantar aspect of the bilateral heels along the plantar fascia. All other joints range of motion within normal limits bilateral. Strength 5/5 in all groups bilateral.   Radiographic exam: Normal osseous mineralization. Joint spaces preserved. No fracture/dislocation/boney destruction. No other soft tissue abnormalities or radiopaque foreign bodies.   Assessment: 1. plantar fasciitis bilateral feet  Plan of Care:  1. Patient evaluated. Xrays reviewed.   2. Injection of 0.5cc Celestone soluspan injected into the bilateral heels.  3.  Continue meloxicam 15 mg daily 4.  Continue custom molded orthotics 5.  Today we had a long detailed conversation regarding the patient's symptoms.  He has had plantar fasciitis off and on for several years.  This is all despite conservative treatment modalities.  I do believe that surgery would be warranted at this point.  At the moment he is a very avid golfer and summertime is just beginning.  We will try and hold off on surgery for now 6.  Return to clinic as needed  *Avid golfer.  Plays in  tournaments.  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  Felecia Shelling, DPM [facility]  Dr. Felecia Shelling, DPM    [address] [place], [place] [address]                Office [phone]  Fax [phone]

## 2021-05-30 NOTE — Progress Notes (Addendum)
Cardiology Office Note:    Date:  05/31/2021   ID:  Thomas Mayo, DOB 1966/10/30, MRN [id number]  PCP:  Donald Prose, [place]  Cardiologist:  Werner Lean, MD  Advanced Practice Provider:  No care team member to display Electrophysiologist:  None   CC: Chest pain follow up.  History of Present Illness:    Thomas Mayo is a 55 y.o. male with a hx of HLD who presents for evaluation 02/17/21. In interim of this visit, patient had elevated CAC and LDL and increased to atorvastatin 20 mg PO Daily.  Seen 05/31/21.  Patient notes that he is doing well.  Since last visit notes no changes.  Relevant interval testing or therapy include sleep testing (OSA diagnosed, pending machine).  There are no interval hospital/ED visit.    No chest pain or pressure .  No SOB/DOE and no PND/Orthopnea.  No weight gain or leg swelling.  No palpitations or syncope .   Past Medical History:  Diagnosis Date  . Chest pain   . High cholesterol     No past surgical history on file.  Current Medications: Current Meds  Medication Sig  . atorvastatin (LIPITOR) 20 MG tablet Take 1 tablet (20 mg total) by mouth daily.  . Coenzyme Q10 (CO Q-10) 100 MG CAPS Take 1 capsule by mouth daily.  . fluticasone (FLONASE) 50 MCG/ACT nasal spray Place 1 spray into both nostrils as needed for allergies.  . meloxicam (MOBIC) 15 MG tablet Take 1 tablet (15 mg total) by mouth daily. (Patient taking differently: Take 15 mg by mouth as needed.)  . Multiple Vitamins-Minerals (PRESERVISION AREDS 2 PO) Take 1 tablet by mouth daily.  . tadalafil (CIALIS) 10 MG tablet Take 10 mg by mouth as needed for erectile dysfunction.     Allergies:   Patient has no known allergies.   Social History   Socioeconomic History  . Marital status: Single    Spouse name: Not on file  . Number of children: Not on file  . Years of education: Not on file  . Highest education level: Not on file   Occupational History  . Not on file  Tobacco Use  . Smoking status: Never Smoker  . Smokeless tobacco: Never Used  Substance and Sexual Activity  . Alcohol use: Never  . Drug use: Never  . Sexual activity: Not on file  Other Topics Concern  . Not on file  Social History Narrative  . Not on file   Social Determinants of Health   Financial Resource Strain: Not on file  Food Insecurity: Not on file  Transportation Needs: Not on file  Physical Activity: Not on file  Stress: Not on file  Social Connections: Not on file   Social:  Has a twin, has a wife; golfing without issue  Family History: The patient's family history includes Heart attack (age of onset: 74) in his father. History of coronary artery disease notable for father-possibly related to Agent Orange.  ROS:   Please see the history of present illness.     All other systems reviewed and are negative.  EKGs/Labs/Other Studies Reviewed:    The following studies were reviewed today:  EKG:  02/17/21: SR 78 WNL (RBBB not met)  CAC: Date:03/15/21 Results: IMPRESSION: 1. Coronary calcium score of 3.6. This was 67th percentile for age, gender, and race matched controls.   Recent Labs: 02/07/2021: BUN 21; Creatinine, Ser 1.05; Hemoglobin 16.5; Platelets 207;  Potassium 3.4; Sodium 139  Recent Lipid Panel    Component Value Date/Time   CHOL 169 03/21/2021 0740   TRIG 60 03/21/2021 0740   HDL 46 03/21/2021 0740   CHOLHDL 3.7 03/21/2021 0740   LDLCALC 111 (H) 03/21/2021 0740     Risk Assessment/Calculations:     N/A  Physical Exam:    VS:  BP 120/82   Pulse 74   Ht 5' 8.5" (1.74 m)   Wt 82.1 kg   SpO2 98%   BMI 27.12 kg/m     Wt Readings from Last 3 Encounters:  05/31/21 82.1 kg  02/17/21 83.9 kg  02/07/21 82.1 kg    GEN:  Well nourished, well developed in no acute distress HEENT: Normal NECK: No JVD; No carotid bruit bilaterally LYMPHATICS: No lymphadenopathy CARDIAC: RRR, no murmurs, rubs,  gallops RESPIRATORY:  Clear to auscultation without rales, wheezing or rhonchi  ABDOMEN: Soft, non-tender, non-distended MUSCULOSKELETAL:  No edema; No deformity  SKIN: Warm and dry NEUROLOGIC:  Alert and oriented x 3 PSYCHIATRIC:  Normal affect   ASSESSMENT:    1. Elevated coronary artery calcium score   2. OSA (obstructive sleep apnea)   3. Mixed hyperlipidemia    PLAN:    In order of problems listed above:  Hyperlipidemia (mixed) Coronary Artery Calcification OSA Chest Pain Syndrome- resolved  - on atorvastatin 20 mg and Coenzyme-Q10 and Lipids and LFTs (today- addendum clarifies this) - reviewed CT scan with patient  - discussed dietary and exercise changes for cardiac prevention; spends 150 minutes a week on activity - pending sleep mask  One year follow up unless new symptoms or abnormal test results warranting change in plan    Medication Adjustments/Labs and Tests Ordered: Current medicines are reviewed at length with the patient today.  Concerns regarding medicines are outlined above.  No orders of the defined types were placed in this encounter.  No orders of the defined types were placed in this encounter.   There are no Patient Instructions on file for this visit.   Signed, Werner Lean, MD  05/31/2021 9:10 AM    [place]

## 2021-05-31 ENCOUNTER — Other Ambulatory Visit: Payer: Self-pay

## 2021-05-31 ENCOUNTER — Encounter: Payer: Self-pay | Admitting: Internal Medicine

## 2021-05-31 ENCOUNTER — Ambulatory Visit: Payer: 59 | Admitting: Internal Medicine

## 2021-05-31 VITALS — BP 120/82 | HR 74 | Ht 68.5 in | Wt 181.0 lb

## 2021-05-31 DIAGNOSIS — E782 Mixed hyperlipidemia: Secondary | ICD-10-CM | POA: Diagnosis not present

## 2021-05-31 DIAGNOSIS — G4733 Obstructive sleep apnea (adult) (pediatric): Secondary | ICD-10-CM | POA: Insufficient documentation

## 2021-05-31 DIAGNOSIS — R931 Abnormal findings on diagnostic imaging of heart and coronary circulation: Secondary | ICD-10-CM | POA: Diagnosis not present

## 2021-05-31 LAB — HEPATIC FUNCTION PANEL
ALT: 28 IU/L (ref 0–44)
AST: 27 IU/L (ref 0–40)
Albumin: 4.5 g/dL (ref 3.8–4.9)
Alkaline Phosphatase: 106 IU/L (ref 44–121)
Bilirubin Total: 0.5 mg/dL (ref 0.0–1.2)
Bilirubin, Direct: 0.14 mg/dL (ref 0.00–0.40)
Total Protein: 6.9 g/dL (ref 6.0–8.5)

## 2021-05-31 LAB — LIPID PANEL
Chol/HDL Ratio: 3.2 ratio (ref 0.0–5.0)
Cholesterol, Total: 168 mg/dL (ref 100–199)
HDL: 52 mg/dL (ref >39)
LDL Chol Calc (NIH): 104 mg/dL — ABNORMAL HIGH (ref 0–99)
Triglycerides: 61 mg/dL (ref 0–149)
VLDL Cholesterol Cal: 12 mg/dL (ref 5–40)

## 2021-05-31 NOTE — Patient Instructions (Signed)
Medication Instructions:  Your physician recommends that you continue on your current medications as directed. Please refer to the Current Medication list given to you today.  *If you need a refill on your cardiac medications before your next appointment, please call your pharmacy*   Lab Work: TODAY: lipid panel and liver function test  If you have labs (blood work) drawn today and your tests are completely normal, you will receive your results only by: Marland Kitchen MyChart Message (if you have MyChart) OR . A paper copy in the mail If you have any lab test that is abnormal or we need to change your treatment, we will call you to review the results.   Testing/Procedures: NONE   Follow-Up: At [facility], you and your health needs are our priority.  As part of our continuing mission to provide you with exceptional heart care, we have created designated Provider Care Teams.  These Care Teams include your primary Cardiologist (physician) and Advanced Practice Providers (APPs -  Physician Assistants and Nurse Practitioners) who all work together to provide you with the care you need, when you need it.  We recommend signing up for the patient portal called "MyChart".  Sign up information is provided on this After Visit Summary.  MyChart is used to connect with patients for Virtual Visits (Telemedicine).  Patients are able to view lab/test results, encounter notes, upcoming appointments, etc.  Non-urgent messages can be sent to your provider as well.   To learn more about what you can do with MyChart, go to [email or web address].    Your next appointment:   12 month(s)  The format for your next appointment:   In Person  Provider:   You may see Christell Constant, MD or one of the following Advanced Practice Providers on your designated Care Team:    Ronie Spies, PA-C  Jacolyn Reedy, PA-C

## 2021-06-01 ENCOUNTER — Telehealth: Payer: Self-pay

## 2021-06-01 DIAGNOSIS — E782 Mixed hyperlipidemia: Secondary | ICD-10-CM

## 2021-06-01 MED ORDER — ATORVASTATIN CALCIUM 40 MG PO TABS: 40.0000 mg | ORAL_TABLET | Freq: Every day | ORAL | 3 refills | Status: DC

## 2021-06-01 NOTE — Telephone Encounter (Signed)
Notified pt of results and MD recommendations.  He is agreeable to plan orders placed.

## 2021-06-01 NOTE — Telephone Encounter (Signed)
-----   Message from Christell Constant, MD sent at 05/31/2021  6:51 PM EDT ----- Results: LDL still above goal Plan: Offer atorvastatin to 40 mg PO Daily and repeat lipids and LFTs in 3 months  Christell Constant, MD

## 2021-06-23 ENCOUNTER — Other Ambulatory Visit: Payer: 59

## 2021-09-01 ENCOUNTER — Other Ambulatory Visit: Payer: 59 | Admitting: *Deleted

## 2021-09-01 ENCOUNTER — Other Ambulatory Visit: Payer: Self-pay

## 2021-09-01 DIAGNOSIS — E782 Mixed hyperlipidemia: Secondary | ICD-10-CM

## 2021-09-01 LAB — HEPATIC FUNCTION PANEL
ALT: 57 IU/L — ABNORMAL HIGH (ref 0–44)
AST: 34 IU/L (ref 0–40)
Albumin: 4.5 g/dL (ref 3.8–4.9)
Alkaline Phosphatase: 111 IU/L (ref 44–121)
Bilirubin Total: 0.4 mg/dL (ref 0.0–1.2)
Bilirubin, Direct: 0.13 mg/dL (ref 0.00–0.40)
Total Protein: 6.9 g/dL (ref 6.0–8.5)

## 2021-09-01 LAB — LIPID PANEL
Chol/HDL Ratio: 3 ratio (ref 0.0–5.0)
Cholesterol, Total: 169 mg/dL (ref 100–199)
HDL: 57 mg/dL (ref >39)
LDL Chol Calc (NIH): 96 mg/dL (ref 0–99)
Triglycerides: 86 mg/dL (ref 0–149)
VLDL Cholesterol Cal: 16 mg/dL (ref 5–40)

## 2021-09-05 ENCOUNTER — Telehealth: Payer: Self-pay

## 2021-09-05 DIAGNOSIS — E782 Mixed hyperlipidemia: Secondary | ICD-10-CM

## 2021-09-05 MED ORDER — ROSUVASTATIN CALCIUM 40 MG PO TABS: 40.0000 mg | ORAL_TABLET | Freq: Every day | ORAL | 3 refills | Status: DC

## 2021-09-05 NOTE — Telephone Encounter (Signed)
-----   Message from Christell Constant, MD sent at 09/02/2021  2:07 PM EDT ----- Results: LDL improved by still above goal ALT slight elevation but < 3X normal Plan: Offer change to rosuvastatin 40 mg PO daily and lipids and LFTs in three months  Christell Constant, MD

## 2021-09-05 NOTE — Telephone Encounter (Signed)
The patient has been notified of the result and verbalized understanding.  All questions (if any) were answered. Macie Burows, RN 09/05/2021 10:50 AM

## 2021-10-02 ENCOUNTER — Other Ambulatory Visit: Payer: Self-pay | Admitting: Podiatry

## 2021-10-03 NOTE — Telephone Encounter (Signed)
Please advise 

## 2021-11-15 ENCOUNTER — Other Ambulatory Visit: Payer: Self-pay | Admitting: Orthopedic Surgery

## 2021-12-05 ENCOUNTER — Other Ambulatory Visit: Payer: 59 | Admitting: *Deleted

## 2021-12-05 ENCOUNTER — Other Ambulatory Visit: Payer: Self-pay

## 2021-12-05 DIAGNOSIS — E782 Mixed hyperlipidemia: Secondary | ICD-10-CM

## 2021-12-05 LAB — LIPID PANEL
Chol/HDL Ratio: 2.9 ratio (ref 0.0–5.0)
Cholesterol, Total: 137 mg/dL (ref 100–199)
HDL: 47 mg/dL (ref >39)
LDL Chol Calc (NIH): 79 mg/dL (ref 0–99)
Triglycerides: 48 mg/dL (ref 0–149)
VLDL Cholesterol Cal: 11 mg/dL (ref 5–40)

## 2021-12-05 LAB — HEPATIC FUNCTION PANEL
ALT: 64 IU/L — ABNORMAL HIGH (ref 0–44)
AST: 43 IU/L — ABNORMAL HIGH (ref 0–40)
Albumin: 4.5 g/dL (ref 3.8–4.9)
Alkaline Phosphatase: 104 IU/L (ref 44–121)
Bilirubin Total: 0.5 mg/dL (ref 0.0–1.2)
Bilirubin, Direct: 0.16 mg/dL (ref 0.00–0.40)
Total Protein: 6.9 g/dL (ref 6.0–8.5)

## 2021-12-07 ENCOUNTER — Encounter: Payer: Self-pay | Admitting: Internal Medicine

## 2022-01-05 ENCOUNTER — Telehealth (INDEPENDENT_AMBULATORY_CARE_PROVIDER_SITE_OTHER): Payer: 59 | Admitting: Cardiology

## 2022-01-05 ENCOUNTER — Encounter: Payer: Self-pay | Admitting: Cardiology

## 2022-01-05 VITALS — Ht 68.5 in | Wt 182.0 lb

## 2022-01-05 DIAGNOSIS — G4733 Obstructive sleep apnea (adult) (pediatric): Secondary | ICD-10-CM | POA: Diagnosis not present

## 2022-01-05 NOTE — Progress Notes (Signed)
Virtual Visit via Video Note   This visit type was conducted due to national recommendations for restrictions regarding the COVID-19 Pandemic (e.g. social distancing) in an effort to limit this patient's exposure and mitigate transmission in our community.  Due to his co-morbid illnesses, this patient is at least at moderate risk for complications without adequate follow up.  This format is felt to be most appropriate for this patient at this time.  All issues noted in this document were discussed and addressed.  A limited physical exam was performed with this format.  Please refer to the patient's chart for his consent to telehealth for [facility].    Date:  01/05/2022   ID:  Thomas Mayo, DOB 07/04/1966, MRN [id number] The patient was identified using 2 identifiers.  Patient Location: Home Provider Location: Home Office   PCP:  Deatra James, MD   [facility] Providers Cardiologist:  Christell Constant, MD     Evaluation Performed:  Follow-Up Visit  Chief Complaint:  OSA  History of Present Illness:    Thomas Mayo is a 56 y.o. male with a history of hyperlipidemia and chest pain.  He mentioned to his cardiologist back in February 2022 that he was having problems feeling sleepy during the day and his wife was complaining that he was snoring and she was noticing that he would stop breathing in his sleep.  Home sleep study was ordered which showed severe obstructive sleep apnea with an AHI of 51.2/h with nocturnal hypoxemia with O2 sats as low as 70%.  He was started on auto CPAP from 4 to 15 cm H2O and is now here for follow-up.  He is doing well with his CPAP device and thinks that he has gotten used to it.  He had a hard time initially finding a mask that worked for him.  He tolerates the nasal pillow mask and feels the pressure is adequate.  Since going on CPAP he feels rested in the am and has no significant daytime sleepiness.  He denies any significant mouth or  nasal dryness or nasal congestion.  He does not think that he snores.     The patient does not have symptoms concerning for COVID-19 infection (fever, chills, cough, or new shortness of breath).    Past Medical History:  Diagnosis Date   Chest pain    High cholesterol    No past surgical history on file.   No outpatient medications have been marked as taking for the 01/05/22 encounter (Video Visit) with Quintella Reichert, MD.     Allergies:   Patient has no known allergies.   Social History   Tobacco Use   Smoking status: Never   Smokeless tobacco: Never  Substance Use Topics   Alcohol use: Never   Drug use: Never     Family Hx: The patient's family history includes Heart attack (age of onset: 82) in his father.  ROS:   Please see the history of present illness.     All other systems reviewed and are negative.   Prior CV studies:   The following studies were reviewed today:  Home sleep study and Pap compliance download  Labs/Other Tests and Data Reviewed:    EKG:  No ECG reviewed.  Recent Labs: 02/07/2021: BUN 21; Creatinine, Ser 1.05; Hemoglobin 16.5; Platelets 207; Potassium 3.4; Sodium 139 12/05/2021: ALT 64   Recent Lipid Panel Lab Results  Component Value Date/Time   CHOL 137 12/05/2021 07:46 AM  TRIG 48 12/05/2021 07:46 AM   HDL 47 12/05/2021 07:46 AM   CHOLHDL 2.9 12/05/2021 07:46 AM   LDLCALC 79 12/05/2021 07:46 AM    Wt Readings from Last 3 Encounters:  05/31/21 181 lb (82.1 kg)  02/17/21 185 lb (83.9 kg)  02/07/21 181 lb (82.1 kg)     Risk Assessment/Calculations:          Objective:    Vital Signs:  There were no vitals taken for this visit.   VITAL SIGNS:  reviewed GEN:  no acute distress EYES:  sclerae anicteric, EOMI - Extraocular Movements Intact RESPIRATORY:  normal respiratory effort, symmetric expansion CARDIOVASCULAR:  no peripheral edema SKIN:  no rash, lesions or ulcers. MUSCULOSKELETAL:  no obvious deformities. NEURO:   alert and oriented x 3, no obvious focal deficit PSYCH:  normal affect  ASSESSMENT & PLAN:    OSA - The patient is tolerating PAP therapy well without any problems. The PAP download performed by his DME was personally reviewed and interpreted by me today and showed an AHI of 10.3/hr on auto PAP  with 100% compliance in using more than 4 hours nightly.  The patient has been using and benefiting from PAP use and will continue to benefit from therapy. -His download shows that he is not adequately treated with auto CPAP due to increased AHI is although improved from his baseline.  I am not surprised of this given his the severity of his OSA. -I am going to increase his auto CPAP to 4 to 20cm H2O and add a chin strap -I encouraged him to avoid sleeping supine  COVID-19 Education: The signs and symptoms of COVID-19 were discussed with the patient and how to seek care for testing (follow up with PCP or arrange E-visit).  The importance of social distancing was discussed today.  Time:   Today, I have spent 15 minutes with the patient with telehealth technology discussing the above problems.     Medication Adjustments/Labs and Tests Ordered: Current medicines are reviewed at length with the patient today.  Concerns regarding medicines are outlined above.   Tests Ordered: No orders of the defined types were placed in this encounter.   Medication Changes: No orders of the defined types were placed in this encounter.   Follow Up: He will follow-up in 8 weeks  Signed, Armanda Magic, MD  01/05/2022 2:09 PM    [facility]

## 2022-01-05 NOTE — Patient Instructions (Signed)
Medication Instructions:  °Your physician recommends that you continue on your current medications as directed. Please refer to the Current Medication list given to you today.  °*If you need a refill on your cardiac medications before your next appointment, please call your pharmacy* ° °Follow-Up: °At [facility], you and your health needs are our priority.  As part of our continuing mission to provide you with exceptional heart care, we have created designated Provider Care Teams.  These Care Teams include your primary Cardiologist (physician) and Advanced Practice Providers (APPs -  Physician Assistants and Nurse Practitioners) who all work together to provide you with the care you need, when you need it. ° °Your next appointment:   °8 week(s) ° °The format for your next appointment:   °Virtual Visit  ° °Provider:   °Traci Turner, MD °

## 2022-01-10 ENCOUNTER — Telehealth: Payer: Self-pay | Admitting: *Deleted

## 2022-01-10 ENCOUNTER — Encounter: Payer: Self-pay | Admitting: *Deleted

## 2022-01-10 DIAGNOSIS — G4733 Obstructive sleep apnea (adult) (pediatric): Secondary | ICD-10-CM

## 2022-01-10 NOTE — Telephone Encounter (Signed)
-----   Message from Theresia Majors, RN sent at 01/05/2022  2:57 PM EST ----- Per Dr. Mayford Knife: increase his auto CPAP to 4 to 20cm H2O and add a chin strap.  Get a download in 4 weeks.    Thanks!

## 2022-01-10 NOTE — Telephone Encounter (Signed)
Order placed to [facility] via community message. 

## 2022-01-10 NOTE — Telephone Encounter (Signed)
-----   Message from Carlyle Overbey, RN sent at 01/05/2022  2:57 PM EST ----- °Per Dr. Turner: °increase his auto CPAP to 4 to 20cm H2O and add a chin strap.  Get a download in 4 weeks.   ° °Thanks! ° °

## 2022-01-10 NOTE — Telephone Encounter (Signed)
This encounter was created in error - please disregard.

## 2022-03-16 ENCOUNTER — Encounter: Payer: Self-pay | Admitting: Cardiology

## 2022-03-16 ENCOUNTER — Other Ambulatory Visit: Payer: Self-pay

## 2022-03-16 ENCOUNTER — Telehealth (INDEPENDENT_AMBULATORY_CARE_PROVIDER_SITE_OTHER): Payer: 59 | Admitting: Cardiology

## 2022-03-16 VITALS — BP 133/91 | HR 72 | Ht 68.5 in | Wt 183.0 lb

## 2022-03-16 DIAGNOSIS — G4733 Obstructive sleep apnea (adult) (pediatric): Secondary | ICD-10-CM | POA: Diagnosis not present

## 2022-03-16 DIAGNOSIS — Z9989 Dependence on other enabling machines and devices: Secondary | ICD-10-CM | POA: Diagnosis not present

## 2022-03-16 NOTE — Progress Notes (Signed)
? ?Virtual Visit via Video Note  ? ?This visit type was conducted due to national recommendations for restrictions regarding the COVID-19 Pandemic (e.g. social distancing) in an effort to limit this patient's exposure and mitigate transmission in our community.  Due to his co-morbid illnesses, this patient is at least at moderate risk for complications without adequate follow up.  This format is felt to be most appropriate for this patient at this time.  All issues noted in this document were discussed and addressed.  A limited physical exam was performed with this format.  Please refer to the patient's chart for his consent to telehealth for [facility]. ? ?  ?Date:  03/16/2022  ? ?ID:  Thomas Mayo, DOB 1966/01/18, MRN [id number] ?The patient was identified using 2 identifiers. ? ?Patient Location: Home ?Provider Location: Home Office ? ? ?PCP:  Deatra James, MD ?  ?[facility] Providers ?Cardiologist:  Christell Constant, MD    ? ?Evaluation Performed:  Follow-Up Visit ? ?Chief Complaint:  OSA ? ?History of Present Illness:   ? ?Thomas Mayo is a 56 y.o. male with a history of hyperlipidemia and chest pain.  He mentioned to his cardiologist back in February 2022 that he was having problems feeling sleepy during the day and his wife was complaining that he was snoring and she was noticing that he would stop breathing in his sleep.  Home sleep study was ordered which showed severe obstructive sleep apnea with an AHI of 51.2/h with nocturnal hypoxemia with O2 sats as low as 70%.  He was started on auto CPAP from 4 to 15 cm H2O.   At his last follow-up his AHI was too high so his auto CPAP was increased to 4 to 20 cm H2O and he is now here for follow-up ? ?He is doing well with his CPAP device and thinks that he has gotten used to it.  He tolerates the nasal pillow mask and feels the pressure is adequate.  Since going on CPAP he feels rested in the am and has no significant daytime sleepiness.  He  denies any significant mouth or nasal dryness or nasal congestion.  He does not think that he snores.    ? ?The patient does not have symptoms concerning for COVID-19 infection (fever, chills, cough, or new shortness of breath).  ? ? ?Past Medical History:  ?Diagnosis Date  ? Chest pain   ? High cholesterol   ? OSA on CPAP   ? ?No past surgical history on file.  ? ?Current Meds  ?Medication Sig  ? Coenzyme Q10 (CO Q-10) 100 MG CAPS Take 1 capsule by mouth daily.  ? doxycycline (ADOXA) 100 MG tablet Take 100 mg by mouth 2 (two) times daily.  ? fluticasone (FLONASE) 50 MCG/ACT nasal spray Place 1 spray into both nostrils as needed for allergies.  ? meloxicam (MOBIC) 15 MG tablet TAKE 1 TABLET (15 MG TOTAL) BY MOUTH DAILY. (Patient taking differently: Take 15 mg by mouth as needed.)  ? Multiple Vitamins-Minerals (PRESERVISION AREDS 2 PO) Take 1 tablet by mouth daily.  ? rosuvastatin (CRESTOR) 40 MG tablet Take 1 tablet (40 mg total) by mouth daily.  ? tadalafil (CIALIS) 10 MG tablet Take 10 mg by mouth as needed for erectile dysfunction.  ?  ? ?Allergies:   Patient has no known allergies.  ? ?Social History  ? ?Tobacco Use  ? Smoking status: Never  ? Smokeless tobacco: Never  ?Substance Use Topics  ? Alcohol  use: Never  ? Drug use: Never  ?  ? ?Family Hx: ?The patient's family history includes Heart attack (age of onset: 8) in his father. ? ?ROS:   ?Please see the history of present illness.    ? ?All other systems reviewed and are negative. ? ? ?Prior CV studies:   ?The following studies were reviewed today: ? ?Home sleep study and Pap compliance download ? ?Labs/Other Tests and Data Reviewed:   ? ?EKG:  No ECG reviewed. ? ?Recent Labs: ?12/05/2021: ALT 64  ? ?Recent Lipid Panel ?Lab Results  ?Component Value Date/Time  ? CHOL 137 12/05/2021 07:46 AM  ? TRIG 48 12/05/2021 07:46 AM  ? HDL 47 12/05/2021 07:46 AM  ? CHOLHDL 2.9 12/05/2021 07:46 AM  ? LDLCALC 79 12/05/2021 07:46 AM  ? ? ?Wt Readings from Last 3  Encounters:  ?03/16/22 183 lb (83 kg)  ?01/05/22 182 lb (82.6 kg)  ?05/31/21 181 lb (82.1 kg)  ?  ? ?Risk Assessment/Calculations:   ?  ? ?    ?Objective:   ? ?Vital Signs:  BP (!) 133/91   Pulse 72   Ht 5' 8.5" (1.74 m)   Wt 183 lb (83 kg)   BMI 27.42 kg/m?   ?Well nourished, well developed male in no acute distress. ?Well appearing, alert and conversant, regular work of breathing,  good skin color  ?Eyes- anicteric ?mouth- oral mucosa is pink  ?neuro- grossly intact ?skin- no apparent rash or lesions or cyanosis  ?ASSESSMENT & PLAN:   ? ?OSA - The patient is tolerating PAP therapy well without any problems. The PAP download performed by his DME was personally reviewed and interpreted by me today and showed an AHI of 7.7 /hr on auto CPAP with 100 % compliance in using more than 4 hours nightly.  The patient has been using and benefiting from PAP use and will continue to benefit from therapy.  ?-he is not a mouth breather and tries to avoid sleeping supine ?-continue current settings>>he has responded beautifully to PAP therapy ?  ?COVID-19 Education: ?The signs and symptoms of COVID-19 were discussed with the patient and how to seek care for testing (follow up with PCP or arrange E-visit).  The importance of social distancing was discussed today. ? ?Time:   ?Today, I have spent 10 minutes with the patient with telehealth technology discussing the above problems.   ? ? ?Medication Adjustments/Labs and Tests Ordered: ?Current medicines are reviewed at length with the patient today.  Concerns regarding medicines are outlined above.  ? ?Tests Ordered: ?No orders of the defined types were placed in this encounter. ? ? ?Medication Changes: ?No orders of the defined types were placed in this encounter. ? ? ?Follow Up: He will follow-up in 1 year ? ?Signed, ?Armanda Magic, MD  ?03/16/2022 9:50 AM    ?[facility] ?

## 2022-03-16 NOTE — Patient Instructions (Signed)

## 2022-04-19 ENCOUNTER — Other Ambulatory Visit: Payer: Self-pay | Admitting: Podiatry

## 2022-04-20 NOTE — Telephone Encounter (Signed)
Please Advise

## 2022-06-27 ENCOUNTER — Other Ambulatory Visit: Payer: Self-pay | Admitting: Podiatry

## 2022-06-30 NOTE — Telephone Encounter (Signed)
Please advise 

## 2022-07-17 ENCOUNTER — Ambulatory Visit: Payer: 59 | Admitting: Internal Medicine

## 2022-07-17 ENCOUNTER — Encounter: Payer: Self-pay | Admitting: Internal Medicine

## 2022-07-17 VITALS — BP 130/80 | HR 65 | Ht 68.5 in | Wt 187.0 lb

## 2022-07-17 DIAGNOSIS — R931 Abnormal findings on diagnostic imaging of heart and coronary circulation: Secondary | ICD-10-CM

## 2022-07-17 DIAGNOSIS — G4733 Obstructive sleep apnea (adult) (pediatric): Secondary | ICD-10-CM | POA: Diagnosis not present

## 2022-07-17 DIAGNOSIS — E782 Mixed hyperlipidemia: Secondary | ICD-10-CM | POA: Diagnosis not present

## 2022-07-17 DIAGNOSIS — Z9989 Dependence on other enabling machines and devices: Secondary | ICD-10-CM

## 2022-07-17 NOTE — Progress Notes (Signed)
Cardiology Office Note:    Date:  07/17/2022   ID:  Thomas Mayo, DOB 12/16/1966, MRN [id number]  PCP:  Donald Prose, [place]  Cardiologist:  Werner Lean, MD  Advanced Practice Provider:  No care team member to display Electrophysiologist:  None   CC: CAC  History of Present Illness:    Thomas Mayo is a 56 y.o. male with a hx of HLD who presents for evaluation 02/17/21. In interim of this visit, patient had elevated CAC and LDL and increased to atorvastatin 20 mg PO Daily.  Seen 05/31/21. 2023: LDL 79  Patient notes that he is doing well.   Since last visit notes that he is walking a lot for work, [place] the [place]  Now on rosuvastatin 40 mg PO. There are no interval hospital/ED visit.    No chest pain or pressure .  No SOB/DOE and no PND/Orthopnea.  No weight gain or leg swelling.  No palpitations or syncope.  Past Medical History:  Diagnosis Date   Chest pain    High cholesterol    OSA on CPAP     No past surgical history on file.  Current Medications: Current Meds  Medication Sig   Coenzyme Q10 (CO Q-10) 100 MG CAPS Take 1 capsule by mouth daily.   fluticasone (FLONASE) 50 MCG/ACT nasal spray Place 1 spray into both nostrils as needed for allergies.   meloxicam (MOBIC) 15 MG tablet Take 1 tablet (15 mg total) by mouth as needed.   Multiple Vitamins-Minerals (PRESERVISION AREDS 2 PO) Take 1 tablet by mouth daily.   rosuvastatin (CRESTOR) 40 MG tablet Take 1 tablet (40 mg total) by mouth daily.   tadalafil (CIALIS) 10 MG tablet Take 10 mg by mouth as needed for erectile dysfunction.     Allergies:   Patient has no known allergies.   Social History   Socioeconomic History   Marital status: Married    Spouse name: Not on file   Number of children: Not on file   Years of education: Not on file   Highest education level: Not on file  Occupational History   Not on file  Tobacco Use   Smoking status: Never    Smokeless tobacco: Never  Substance and Sexual Activity   Alcohol use: Never   Drug use: Never   Sexual activity: Not on file  Other Topics Concern   Not on file  Social History Narrative   Not on file   Social Determinants of Health   Financial Resource Strain: Not on file  Food Insecurity: Not on file  Transportation Needs: Not on file  Physical Activity: Not on file  Stress: Not on file  Social Connections: Not on file   Social:  Has a twin, has a wife; golfing without issue Works for the city.  Family History: The patient's family history includes Heart attack (age of onset: 20) in his father. History of coronary artery disease notable for father-possibly related to Agent Orange.  ROS:   Please see the history of present illness.     All other systems reviewed and are negative.  EKGs/Labs/Other Studies Reviewed:    The following studies were reviewed today:  EKG:  07/17/22: SR rate iRBBB,  02/17/21: SR 78 WNL (RBBB not met)  CAC: Date:03/15/21 Results: IMPRESSION: 1. Coronary calcium score of 3.6. This was 67th percentile for age, gender, and race matched controls.   Recent Labs: 12/05/2021: ALT 64  Recent Lipid Panel    Component Value Date/Time   CHOL 137 12/05/2021 0746   TRIG 48 12/05/2021 0746   HDL 47 12/05/2021 0746   CHOLHDL 2.9 12/05/2021 0746   LDLCALC 79 12/05/2021 0746   Physical Exam:    VS:  BP 130/80   Pulse 65   Ht 5' 8.5" (1.74 m)   Wt 187 lb (84.8 kg)   SpO2 98%   BMI 28.02 kg/m     Wt Readings from Last 3 Encounters:  07/17/22 187 lb (84.8 kg)  03/16/22 183 lb (83 kg)  01/05/22 182 lb (82.6 kg)    GEN:  Well nourished, well developed in no acute distress HEENT: Normal NECK: No JVD LYMPHATICS: No lymphadenopathy CARDIAC: RRR, no murmurs, rubs, gallops RESPIRATORY:  Clear to auscultation without rales, wheezing or rhonchi  ABDOMEN: Soft, non-tender, non-distended MUSCULOSKELETAL:  No edema; No deformity  SKIN: Warm  and dry NEUROLOGIC:  Alert and oriented x 3 PSYCHIATRIC:  Normal affect   ASSESSMENT:    1. Elevated coronary artery calcium score   2. Mixed hyperlipidemia   3. OSA on CPAP     PLAN:    Coronary Artery Calcification Hyperlipidemia (mixed) OSA now on CPAP - on rosuvastatin 40 mg and Coenzyme-Q10  -  Lipids and LFTs  fasting, may add zetia 10 mg - discussed dietary and exercise changes for cardiac prevention (focusing on added sugars and preserved food)  One year me or app    Medication Adjustments/Labs and Tests Ordered: Current medicines are reviewed at length with the patient today.  Concerns regarding medicines are outlined above.  No orders of the defined types were placed in this encounter.  No orders of the defined types were placed in this encounter.   There are no Patient Instructions on file for this visit.   Signed, Werner Lean, MD  07/17/2022 5:16 PM    [place]

## 2022-07-17 NOTE — Patient Instructions (Signed)
Medication Instructions:  Your physician recommends that you continue on your current medications as directed. Please refer to the Current Medication list given to you today.  *If you need a refill on your cardiac medications before your next appointment, please call your pharmacy*   Lab Work: Jul 31, 2022: Fasting Lipid Panel and ALT (nothing to eat or drink 8-12 hours prior except water and black coffee)   If you have labs (blood work) drawn today and your tests are completely normal, you will receive your results only by: MyChart Message (if you have MyChart) OR A paper copy in the mail If you have any lab test that is abnormal or we need to change your treatment, we will call you to review the results.   Testing/Procedures: NONE   Follow-Up: At [facility], you and your health needs are our priority.  As part of our continuing mission to provide you with exceptional heart care, we have created designated Provider Care Teams.  These Care Teams include your primary Cardiologist (physician) and Advanced Practice Providers (APPs -  Physician Assistants and Nurse Practitioners) who all work together to provide you with the care you need, when you need it.  We recommend signing up for the patient portal called "MyChart".  Sign up information is provided on this After Visit Summary.  MyChart is used to connect with patients for Virtual Visits (Telemedicine).  Patients are able to view lab/test results, encounter notes, upcoming appointments, etc.  Non-urgent messages can be sent to your provider as well.   To learn more about what you can do with MyChart, go to [email or web address].    Your next appointment:   1 year(s)  The format for your next appointment:   In Person  Provider:   Christell Constant, MD     Important Information About Sugar

## 2022-07-31 ENCOUNTER — Other Ambulatory Visit: Payer: 59

## 2022-07-31 DIAGNOSIS — R931 Abnormal findings on diagnostic imaging of heart and coronary circulation: Secondary | ICD-10-CM

## 2022-07-31 DIAGNOSIS — E782 Mixed hyperlipidemia: Secondary | ICD-10-CM

## 2022-07-31 LAB — ALT: ALT: 28 IU/L (ref 0–44)

## 2022-07-31 LAB — LIPID PANEL
Chol/HDL Ratio: 3 ratio (ref 0.0–5.0)
Cholesterol, Total: 140 mg/dL (ref 100–199)
HDL: 46 mg/dL (ref >39)
LDL Chol Calc (NIH): 81 mg/dL (ref 0–99)
Triglycerides: 60 mg/dL (ref 0–149)
VLDL Cholesterol Cal: 13 mg/dL (ref 5–40)

## 2022-08-02 ENCOUNTER — Telehealth: Payer: Self-pay

## 2022-08-02 MED ORDER — EZETIMIBE 10 MG PO TABS: 10.0000 mg | ORAL_TABLET | Freq: Every day | ORAL | 3 refills | Status: DC

## 2022-08-02 NOTE — Telephone Encounter (Signed)
-----   Message from Christell Constant, MD sent at 08/02/2022 11:32 AM EDT ----- Results: ALT normal, LDL above goal Plan: Offer zetia 10 mg  Christell Constant, MD

## 2022-08-02 NOTE — Telephone Encounter (Signed)
The patient has been notified of the result and verbalized understanding.  All questions (if any) were answered. Macie Burows, RN 08/02/2022 2:56 PM

## 2022-09-06 ENCOUNTER — Other Ambulatory Visit: Payer: Self-pay | Admitting: Internal Medicine

## 2022-09-06 ENCOUNTER — Other Ambulatory Visit: Payer: Self-pay | Admitting: Podiatry

## 2022-11-10 ENCOUNTER — Other Ambulatory Visit: Payer: Self-pay | Admitting: Podiatry

## 2023-03-09 ENCOUNTER — Other Ambulatory Visit: Payer: Self-pay | Admitting: Internal Medicine

## 2023-08-08 ENCOUNTER — Other Ambulatory Visit: Payer: Self-pay | Admitting: Internal Medicine

## 2023-09-06 ENCOUNTER — Other Ambulatory Visit: Payer: Self-pay | Admitting: Internal Medicine

## 2023-09-09 ENCOUNTER — Other Ambulatory Visit: Payer: Self-pay | Admitting: Internal Medicine

## 2023-10-10 ENCOUNTER — Other Ambulatory Visit: Payer: Self-pay | Admitting: *Deleted

## 2023-10-10 ENCOUNTER — Telehealth: Payer: Self-pay | Admitting: Internal Medicine

## 2023-10-10 MED ORDER — ROSUVASTATIN CALCIUM 40 MG PO TABS: 40.0000 mg | ORAL_TABLET | Freq: Every day | ORAL | 0 refills | Status: DC

## 2023-10-10 NOTE — Telephone Encounter (Signed)
*  STAT* If patient is at the pharmacy, call can be transferred to refill team.   1. Which medications need to be refilled? (please list name of each medication and dose if known) rosuvastatin (CRESTOR) 40 MG tablet   2. Would you like to learn more about the convenience, safety, & potential cost savings by using the [facility]? No    3. Are you open to using the [facility] No   4. Which pharmacy/location (including street and city if local pharmacy) is medication to be sent to? [id number] - [place], [place] - [address] AT [address] OF [address]   5. Do they need a 30 day or 90 day supply? 90 Day Supply

## 2023-12-31 ENCOUNTER — Ambulatory Visit: Payer: 59 | Attending: Cardiology | Admitting: Cardiology

## 2023-12-31 VITALS — BP 130/78 | Ht 68.5 in | Wt 187.8 lb

## 2023-12-31 DIAGNOSIS — G4733 Obstructive sleep apnea (adult) (pediatric): Secondary | ICD-10-CM | POA: Diagnosis not present

## 2023-12-31 MED ORDER — SALINE SPRAY 0.65 % NA SOLN: 2.0000 | Freq: Two times a day (BID) | NASAL | 3 refills | Status: AC

## 2023-12-31 NOTE — Progress Notes (Signed)
 Date:  12/31/2023   ID:  Thomas Mayo, DOB 07-Oct-1966, MRN [id number] The patient was identified using 2 identifiers.  PCP:  Sun, Vyvyan, MD   [facility] Providers Cardiologist:  Stanly DELENA Leavens, MD     Evaluation Performed:  Follow-Up Visit  Chief Complaint:  OSA  History of Present Illness:    Thomas Mayo is a 58 y.o. male with a history of hyperlipidemia and chest pain.  He mentioned to his cardiologist back in February 2022 that he was having problems feeling sleepy during the day and his wife was complaining that he was snoring and she was noticing that he would stop breathing in his sleep.  Home sleep study was ordered which showed severe obstructive sleep apnea with an AHI of 51.2/h with nocturnal hypoxemia with O2 sats as low as 70%.  He has been on auto CPAP from 4 to 20 cm H2O.  He is doing well with his PAP device and thinks that he has gotten used to it.  He tolerates the mask and feels the pressure is adequate.  He does have some problems with congestion in his nose. Since going on PAP He feels rested in the am and has no significant daytime sleepiness.  He denies any significant mouth or nasal dryness .  He does not think that he snores.   Past Medical History:  Diagnosis Date   Chest pain    High cholesterol    OSA on CPAP    No past surgical history on file.   No outpatient medications have been marked as taking for the 12/31/23 encounter (Appointment) with Shlomo Wilbert SAUNDERS, MD.     Allergies:   Patient has no known allergies.   Social History   Tobacco Use   Smoking status: Never   Smokeless tobacco: Never  Substance Use Topics   Alcohol use: Never   Drug use: Never     Family Hx: The patient's family history includes Heart attack (age of onset: 30) in his father.  ROS:   Please see the history of present illness.     All other systems reviewed and are negative.   Prior CV studies:   The following studies were reviewed  today:  Home sleep study and Pap compliance download  Labs/Other Tests and Data Reviewed:     Recent Labs: No results found for requested labs within last 365 days.   Recent Lipid Panel Lab Results  Component Value Date/Time   CHOL 140 07/31/2022 07:31 AM   TRIG 60 07/31/2022 07:31 AM   HDL 46 07/31/2022 07:31 AM   CHOLHDL 3.0 07/31/2022 07:31 AM   LDLCALC 81 07/31/2022 07:31 AM    Wt Readings from Last 3 Encounters:  07/17/22 187 lb (84.8 kg)  03/16/22 183 lb (83 kg)  01/05/22 182 lb (82.6 kg)     Risk Assessment/Calculations:          Objective:    Vital Signs:  There were no vitals taken for this visit.  GEN: Well nourished, well developed in no acute distress HEENT: Normal NECK: No JVD; No carotid bruits LYMPHATICS: No lymphadenopathy CARDIAC:RRR, no murmurs, rubs, gallops RESPIRATORY:  Clear to auscultation without rales, wheezing or rhonchi  ABDOMEN: Soft, non-tender, non-distended MUSCULOSKELETAL:  No edema; No deformity  SKIN: Warm and dry NEUROLOGIC:  Alert and oriented x 3 PSYCHIATRIC:  Normal affect  ASSESSMENT & PLAN:    OSA - The patient is tolerating PAP therapy well without any  problems. The PAP download performed by his DME was personally reviewed and interpreted by me today and showed an AHI of 5.5 /hr on auto CPAP from 4-20 cm H2O with 93% compliance in using more than 4 hours nightly.  The patient has been using and benefiting from PAP use and will continue to benefit from therapy.  -Encouraged him to try to avoid sleeping supine -I will order a chinstrap since his AHI is mildly elevated as I suspect he is breathing through his mouth some -Recommend nasal saline spray 2 sprays each nostril twice daily to help with nasal congestion   Time:   Today, I have spent 15 minutes with the patient with telehealth technology discussing the above problems.     Medication Adjustments/Labs and Tests Ordered: Current medicines are reviewed at length with  the patient today.  Concerns regarding medicines are outlined above.   Tests Ordered: No orders of the defined types were placed in this encounter.   Medication Changes: No orders of the defined types were placed in this encounter.   Follow Up: He will follow-up in 3 months  Signed, Wilbert Bihari, MD  12/31/2023 2:58 PM    [facility]

## 2023-12-31 NOTE — Patient Instructions (Signed)
 Medication Instructions:  Please start using nasal spray 2 sprays each nostril twice a day.  *If you need a refill on your cardiac medications before your next appointment, please call your pharmacy*   Lab Work: None.  If you have labs (blood work) drawn today and your tests are completely normal, you will receive your results only by: MyChart Message (if you have MyChart) OR A paper copy in the mail If you have any lab test that is abnormal or we need to change your treatment, we will call you to review the results.   Testing/Procedures: None.   Follow-Up: At [facility], you and your health needs are our priority.  As part of our continuing mission to provide you with exceptional heart care, we have created designated Provider Care Teams.  These Care Teams include your primary Cardiologist (physician) and Advanced Practice Providers (APPs -  Physician Assistants and Nurse Practitioners) who all work together to provide you with the care you need, when you need it.  We recommend signing up for the patient portal called MyChart.  Sign up information is provided on this After Visit Summary.  MyChart is used to connect with patients for Virtual Visits (Telemedicine).  Patients are able to view lab/test results, encounter notes, upcoming appointments, etc.  Non-urgent messages can be sent to your provider as well.   To learn more about what you can do with MyChart, go to [email or web address].    Your next appointment:   3 months  Provider:   Dr. Wilbert Bihari, MD   Other Instructions Dr. Bihari has ordered a chin strap for you. Your DME company should contact you about pick up or delivery.

## 2024-01-03 ENCOUNTER — Telehealth: Payer: Self-pay | Admitting: *Deleted

## 2024-01-03 DIAGNOSIS — G4733 Obstructive sleep apnea (adult) (pediatric): Secondary | ICD-10-CM

## 2024-01-03 NOTE — Telephone Encounter (Signed)
-----   Message from Nurse Alcario Drought E sent at 12/31/2023  3:30 PM EST ----- Regarding: chin strap Dr. Mayford Knife would like to order a chin strap for this patient. Alcario Drought, RN

## 2024-01-03 NOTE — Telephone Encounter (Signed)
Order placed to [facility] via community message. 

## 2024-01-09 ENCOUNTER — Other Ambulatory Visit: Payer: Self-pay | Admitting: Internal Medicine

## 2024-01-10 ENCOUNTER — Encounter: Payer: Self-pay | Admitting: Internal Medicine

## 2024-01-10 ENCOUNTER — Ambulatory Visit: Payer: 59 | Attending: Internal Medicine | Admitting: Internal Medicine

## 2024-01-10 VITALS — BP 128/80 | HR 64 | Ht 68.5 in | Wt 183.0 lb

## 2024-01-10 DIAGNOSIS — I444 Left anterior fascicular block: Secondary | ICD-10-CM

## 2024-01-10 DIAGNOSIS — R931 Abnormal findings on diagnostic imaging of heart and coronary circulation: Secondary | ICD-10-CM

## 2024-01-10 DIAGNOSIS — G4733 Obstructive sleep apnea (adult) (pediatric): Secondary | ICD-10-CM | POA: Diagnosis not present

## 2024-01-10 DIAGNOSIS — E782 Mixed hyperlipidemia: Secondary | ICD-10-CM

## 2024-01-10 NOTE — Patient Instructions (Signed)
 Medication Instructions:  Your physician recommends that you continue on your current medications as directed. Please refer to the Current Medication list given to you today.  *If you need a refill on your cardiac medications before your next appointment, please call your pharmacy*   Lab Work: NONE If you have labs (blood work) drawn today and your tests are completely normal, you will receive your results only by: MyChart Message (if you have MyChart) OR A paper copy in the mail If you have any lab test that is abnormal or we need to change your treatment, we will call you to review the results.   Testing/Procedures: NONE   Follow-Up: At [facility], you and your health needs are our priority.  As part of our continuing mission to provide you with exceptional heart care, we have created designated Provider Care Teams.  These Care Teams include your primary Cardiologist (physician) and Advanced Practice Providers (APPs -  Physician Assistants and Nurse Practitioners) who all work together to provide you with the care you need, when you need it.  We recommend signing up for the patient portal called "MyChart".  Sign up information is provided on this After Visit Summary.  MyChart is used to connect with patients for Virtual Visits (Telemedicine).  Patients are able to view lab/test results, encounter notes, upcoming appointments, etc.  Non-urgent messages can be sent to your provider as well.   To learn more about what you can do with MyChart, go to [email or web address].    Your next appointment:   1 year(s)  Provider:   Riley Lam, MD

## 2024-01-10 NOTE — Progress Notes (Signed)
Cardiology Office Note:    Date:  01/10/2024   ID:  Thomas Mayo, DOB 12-Oct-1966, MRN [id number]  PCP:  Deatra James, MD   [facility]  Cardiologist:  Christell Constant, MD  Advanced Practice Provider:  No care team member to display Electrophysiologist:  None   CC: CAC f/u  History of Present Illness:    Thomas Mayo is a 58 y.o. male with a hx of HLD who presents for evaluation 02/17/21. In interim of this visit, patient had elevated CAC and LDL and increased to atorvastatin 20 mg PO Daily.  Seen 05/31/21. 2023: LDL 79  Thomas Mayo, a 58 year old individual with a history of coronary artery calcifications, hyperlipidemia, and obstructive sleep apnea, presents for a one-year follow-up visit. The patient has been adhering to goal-directed medical therapy, with the most recent LDL level recorded at 79. He reports no symptoms and has been maintaining an active lifestyle, including regular golfing. The patient has been using CPAP for his sleep apnea and reports no issues.  The patient's EKG shows an incomplete right bundle branch block, which has remained unchanged since the last evaluation. He denies any chest pain or discomfort, even during physical activities such as golfing. The patient's cholesterol levels have been improving, although he admits to some dietary indulgences during the holiday season.  The patient also has a history of mild transaminitis, which has since resolved. He expresses a preference for lifestyle modifications over additional medications for managing his health conditions.  Past Medical History:  Diagnosis Date   Chest pain    High cholesterol    OSA on CPAP     No past surgical history on file.  Current Medications: Current Meds  Medication Sig   fluticasone (FLONASE) 50 MCG/ACT nasal spray Place 1 spray into both nostrils as needed for allergies.     Allergies:   Patient has no known allergies.   Social History    Socioeconomic History   Marital status: Married    Spouse name: Not on file   Number of children: Not on file   Years of education: Not on file   Highest education level: Not on file  Occupational History   Not on file  Tobacco Use   Smoking status: Never   Smokeless tobacco: Never  Substance and Sexual Activity   Alcohol use: Never   Drug use: Never   Sexual activity: Not on file  Other Topics Concern   Not on file  Social History Narrative   Not on file   Social Drivers of Health   Financial Resource Strain: Not on file  Food Insecurity: Not on file  Transportation Needs: Not on file  Physical Activity: Not on file  Stress: Not on file  Social Connections: Not on file   Social:  Has a twin, has a wife; golfing without issue Works for the city.  Family History: The patient's family history includes Heart attack (age of onset: 5) in his father. History of coronary artery disease notable for father-possibly related to Agent Orange.  ROS:   Please see the history of present illness.     All other systems reviewed and are negative.  EKGs/Labs/Other Studies Reviewed:    The following studies were reviewed today:  EKG:  07/17/22: SR rate iRBBB,  02/17/21: SR 78 WNL (RBBB not met)  CAC: Date:03/15/21 Results: IMPRESSION: 1. Coronary calcium score of 3.6. This was 67th percentile for age, gender, and race matched controls.  Recent Labs: No results found for requested labs within last 365 days.  Recent Lipid Panel    Component Value Date/Time   CHOL 140 07/31/2022 0731   TRIG 60 07/31/2022 0731   HDL 46 07/31/2022 0731   CHOLHDL 3.0 07/31/2022 0731   LDLCALC 81 07/31/2022 0731   Physical Exam:    VS:  BP 128/80 (BP Location: Left Arm)   Pulse 64   Ht 5' 8.5" (1.74 m)   Wt 183 lb (83 kg)   SpO2 96%   BMI 27.42 kg/m     Wt Readings from Last 3 Encounters:  01/10/24 183 lb (83 kg)  12/31/23 187 lb 12.8 oz (85.2 kg)  07/17/22 187 lb (84.8 kg)     GEN:  Well nourished, well developed in no acute distress HEENT: Normal NECK: No JVD No bruits CARDIAC: RRR, no murmurs, rubs, gallops RESPIRATORY:  Clear to auscultation without rales, wheezing or rhonchi  ABDOMEN: Soft, non-tender, non-distended MUSCULOSKELETAL:  No edema; No deformity  SKIN: Warm and dry NEUROLOGIC:  Alert and oriented x 3 PSYCHIATRIC:  Normal affect   ASSESSMENT:    1. Mixed hyperlipidemia   2. Elevated coronary artery calcium score   3. OSA on CPAP   4. LAFB (left anterior fascicular block)      PLAN:    Coronary Artery Calcifications 59 year old with coronary artery calcifications, currently asymptomatic. LDL was 79 at the last evaluation. Discussed colchicine for inflammation reduction; benefits include reduced inflammation in coronary artery disease, but potential GI side effects like diarrhea. Patient prefers to avoid additional medications.  - Continue current medications  Hyperlipidemia LDL levels have improved significantly. Discussed more aggressive LDL lowering to under 55 as per European guidelines. Patient prefers to avoid additional medications.   Incomplete Right Bundle Branch Block and LAFB EKG shows an incomplete right bundle branch block, unchanged. No symptoms or concerns. Explained that this condition is not worrisome unless it progresses significantly.   Obstructive Sleep Apnea On CPAP therapy and doing well. No new symptoms reported.  General Health Maintenance Active and generally healthy. Liver function tests and blood counts are normal. Discussed the importance of maintaining physical activity and a healthy diet. - Encourage regular physical activity - Maintain a healthy diet  Follow-up - Schedule follow-up appointment in one year (offered PRN)    Medication Adjustments/Labs and Tests Ordered: Current medicines are reviewed at length with the patient today.  Concerns regarding medicines are outlined above.  Orders Placed  This Encounter  Procedures   EKG 12-Lead   No orders of the defined types were placed in this encounter.   Patient Instructions  Medication Instructions:  Your physician recommends that you continue on your current medications as directed. Please refer to the Current Medication list given to you today.  *If you need a refill on your cardiac medications before your next appointment, please call your pharmacy*   Lab Work: NONE If you have labs (blood work) drawn today and your tests are completely normal, you will receive your results only by: MyChart Message (if you have MyChart) OR A paper copy in the mail If you have any lab test that is abnormal or we need to change your treatment, we will call you to review the results.   Testing/Procedures: NONE   Follow-Up: At [facility], you and your health needs are our priority.  As part of our continuing mission to provide you with exceptional heart care, we have created designated Provider Care Teams.  These Care Teams include your primary Cardiologist (physician) and Advanced Practice Providers (APPs -  Physician Assistants and Nurse Practitioners) who all work together to provide you with the care you need, when you need it.  We recommend signing up for the patient portal called "MyChart".  Sign up information is provided on this After Visit Summary.  MyChart is used to connect with patients for Virtual Visits (Telemedicine).  Patients are able to view lab/test results, encounter notes, upcoming appointments, etc.  Non-urgent messages can be sent to your provider as well.   To learn more about what you can do with MyChart, go to [email or web address].    Your next appointment:   1 year(s)  Provider:   Riley Lam, MD       Signed, Christell Constant, MD  01/10/2024 1:46 PM    [facility]

## 2024-01-25 ENCOUNTER — Ambulatory Visit: Payer: 59 | Admitting: Internal Medicine

## 2024-02-02 ENCOUNTER — Other Ambulatory Visit: Payer: Self-pay | Admitting: Internal Medicine

## 2024-03-11 ENCOUNTER — Other Ambulatory Visit: Payer: Self-pay | Admitting: Internal Medicine

## 2024-04-01 ENCOUNTER — Ambulatory Visit: Payer: 59 | Admitting: Cardiology

## 2024-07-22 ENCOUNTER — Encounter: Payer: Self-pay | Admitting: Cardiology

## 2024-07-22 ENCOUNTER — Ambulatory Visit: Attending: Cardiology | Admitting: Cardiology

## 2024-07-22 VITALS — BP 118/62 | HR 72 | Ht 68.5 in | Wt 188.2 lb

## 2024-07-22 DIAGNOSIS — G4733 Obstructive sleep apnea (adult) (pediatric): Secondary | ICD-10-CM

## 2024-07-22 NOTE — Patient Instructions (Signed)
 Medication Instructions:  Your physician recommends that you continue on your current medications as directed. Please refer to the Current Medication list given to you today.  *If you need a refill on your cardiac medications before your next appointment, please call your pharmacy*  Lab Work: NONE If you have labs (blood work) drawn today and your tests are completely normal, you will receive your results only by: MyChart Message (if you have MyChart) OR A paper copy in the mail If you have any lab test that is abnormal or we need to change your treatment, we will call you to review the results.  Testing/Procedures: NONE  Follow-Up: At [facility], you and your health needs are our priority.  As part of our continuing mission to provide you with exceptional heart care, our providers are all part of one team.  This team includes your primary Cardiologist (physician) and Advanced Practice Providers or APPs (Physician Assistants and Nurse Practitioners) who all work together to provide you with the care you need, when you need it.  Your next appointment:   1 year(s)  Provider:   Shlomo, MD  We recommend signing up for the patient portal called MyChart.  Sign up information is provided on this After Visit Summary.  MyChart is used to connect with patients for Virtual Visits (Telemedicine).  Patients are able to view lab/test results, encounter notes, upcoming appointments, etc.  Non-urgent messages can be sent to your provider as well.   To learn more about what you can do with MyChart, go to [email or web address].

## 2024-07-22 NOTE — Progress Notes (Signed)
 Date:  07/22/2024   ID:  Thomas Mayo, DOB September 01, 1966, MRN [id number] The patient was identified using 2 identifiers.  PCP:  Sun, Vyvyan, MD   [facility] Providers Cardiologist:  Stanly DELENA Leavens, MD     Evaluation Performed:  Follow-Up Visit  Chief Complaint:  OSA  History of Present Illness:    Thomas Mayo is a 58 y.o. male with a history of hyperlipidemia and chest pain.  He mentioned to his cardiologist back in February 2022 that he was having problems feeling sleepy during the day and his wife was complaining that he was snoring and she was noticing that he would stop breathing in his sleep.  Home sleep study was ordered which showed severe obstructive sleep apnea with an AHI of 51.2/h with nocturnal hypoxemia with O2 sats as low as 70%.  He has been on auto CPAP from 4 to 20 cm H2O.  He is doing well with his he PAP device and thinks that he has gotten used to it.  He tolerates the nasal pillows mask abut still has some irritation of his nostrils. He feels the pressure is adequate.  He denies any significant mouth or nasal dryness or nasal congestion.  He does not think that he snores. An Epworth Sleepiness Scale score was calculated the office today and this endorsed at 15 indicating residual daytime sleepiness. Patient denies any episodes of bruxism, restless legs, No gagging hallucinations or cataplectic events.  He goes to bed at 11:30PM and falls asleep with int 10 minutes and wakes up during the night due to nosies and get up at 5:45AM.    Past Medical History:  Diagnosis Date   Chest pain    High cholesterol    OSA on CPAP    No past surgical history on file.   No outpatient medications have been marked as taking for the 07/22/24 encounter (Office Visit) with Shlomo Wilbert SAUNDERS, MD.     Allergies:   Patient has no known allergies.   Social History   Tobacco Use   Smoking status: Never   Smokeless tobacco: Never  Substance Use Topics   Alcohol  use: Never   Drug use: Never     Family Hx: The patient's family history includes Heart attack (age of onset: 38) in his father.  ROS:   Please see the history of present illness.     All other systems reviewed and are negative.   Prior CV studies:   The following studies were reviewed today:  Home sleep study and Pap compliance download  Labs/Other Tests and Data Reviewed:     Recent Labs: No results found for requested labs within last 365 days.   Recent Lipid Panel Lab Results  Component Value Date/Time   CHOL 140 07/31/2022 07:31 AM   TRIG 60 07/31/2022 07:31 AM   HDL 46 07/31/2022 07:31 AM   CHOLHDL 3.0 07/31/2022 07:31 AM   LDLCALC 81 07/31/2022 07:31 AM    Wt Readings from Last 3 Encounters:  01/10/24 183 lb (83 kg)  12/31/23 187 lb 12.8 oz (85.2 kg)  07/17/22 187 lb (84.8 kg)     Risk Assessment/Calculations:          Objective:    Vital Signs:  There were no vitals taken for this visit.  GEN: Well nourished, well developed in no acute distress HEENT: Normal NECK: No JVD; No carotid bruits LYMPHATICS: No lymphadenopathy CARDIAC:RRR, no murmurs, rubs, gallops RESPIRATORY:  Clear to  auscultation without rales, wheezing or rhonchi  ABDOMEN: Soft, non-tender, non-distended MUSCULOSKELETAL:  No edema; No deformity  SKIN: Warm and dry NEUROLOGIC:  Alert and oriented x 3 PSYCHIATRIC:  Normal affect  ASSESSMENT & PLAN:    OSA - The patient is tolerating PAP therapy well without any problems. The PAP download performed by his DME was personally reviewed and interpreted by me today and showed an AHI of 6.3/hr on Auto CPAP 4-20 /hr cm H2O with 100% compliance in using more than 4 hours nightly.  The patient has been using and benefiting from PAP use and will continue to benefit from therapy.  -I suspect that he is mouth breathing some so I will add a chin strap -he has been having problems with nasal irritation with the pillows so will order a nasal cushion  mask -I have encouraged him to try to go to bed earlier in the night to try to get 8 hours of sleep  Time:   Today, I have spent 15 minutes with the patient with telehealth technology discussing the above problems.     Medication Adjustments/Labs and Tests Ordered: Current medicines are reviewed at length with the patient today.  Concerns regarding medicines are outlined above.   Tests Ordered: No orders of the defined types were placed in this encounter.   Medication Changes: No orders of the defined types were placed in this encounter.   Follow Up: He will follow-up in 3 months  Signed, Wilbert Bihari, MD  07/22/2024 2:37 PM    [facility]

## 2024-07-23 ENCOUNTER — Telehealth: Payer: Self-pay

## 2024-07-23 DIAGNOSIS — E782 Mixed hyperlipidemia: Secondary | ICD-10-CM

## 2024-07-23 DIAGNOSIS — I444 Left anterior fascicular block: Secondary | ICD-10-CM

## 2024-07-23 DIAGNOSIS — G4733 Obstructive sleep apnea (adult) (pediatric): Secondary | ICD-10-CM

## 2024-07-23 DIAGNOSIS — R931 Abnormal findings on diagnostic imaging of heart and coronary circulation: Secondary | ICD-10-CM

## 2024-07-23 NOTE — Telephone Encounter (Signed)
-----   Message from Wilbert Bihari sent at 07/22/2024  2:48 PM EDT ----- Order a nasal cushion mask and chin strap

## 2024-07-23 NOTE — Telephone Encounter (Signed)
 Order for a nasal cushion mask and chin strap sent to [facility]

## 2024-12-03 ENCOUNTER — Encounter: Payer: Self-pay | Admitting: Internal Medicine
